# Patient Record
Sex: Male | Born: 1940 | Race: White | Hispanic: No | Marital: Single | Smoking: Former smoker
Health system: Southern US, Community
[De-identification: ages and names within clinical notes are randomized; demographics above are authoritative.]

## PROBLEM LIST (undated history)

## (undated) DIAGNOSIS — J189 Pneumonia, unspecified organism: Secondary | ICD-10-CM

## (undated) DIAGNOSIS — D696 Thrombocytopenia, unspecified: Secondary | ICD-10-CM

## (undated) DIAGNOSIS — C61 Malignant neoplasm of prostate: Secondary | ICD-10-CM

## (undated) DIAGNOSIS — R42 Dizziness and giddiness: Secondary | ICD-10-CM

## (undated) DIAGNOSIS — C7951 Secondary malignant neoplasm of bone: Secondary | ICD-10-CM

## (undated) DIAGNOSIS — D649 Anemia, unspecified: Secondary | ICD-10-CM

## (undated) DIAGNOSIS — M1990 Unspecified osteoarthritis, unspecified site: Secondary | ICD-10-CM

## (undated) HISTORY — PX: OTHER SURGICAL HISTORY: SHX169

## (undated) HISTORY — PX: NASAL SEPTUM SURGERY: SHX37

## (undated) HISTORY — PX: BACK SURGERY: SHX140

## (undated) HISTORY — PX: CERVICAL DISC SURGERY: SHX588

---

## 2006-04-29 DIAGNOSIS — C61 Malignant neoplasm of prostate: Secondary | ICD-10-CM

## 2006-04-29 HISTORY — DX: Secondary malignant neoplasm of bone: C61

## 2006-05-30 ENCOUNTER — Ambulatory Visit: Payer: Self-pay | Admitting: Urology

## 2007-07-22 ENCOUNTER — Ambulatory Visit: Payer: Self-pay | Admitting: Urology

## 2009-07-28 ENCOUNTER — Ambulatory Visit: Payer: Self-pay | Admitting: Urology

## 2009-07-28 ENCOUNTER — Ambulatory Visit: Payer: Self-pay | Admitting: Internal Medicine

## 2009-08-08 ENCOUNTER — Ambulatory Visit: Payer: Self-pay | Admitting: Internal Medicine

## 2009-08-27 ENCOUNTER — Ambulatory Visit: Payer: Self-pay | Admitting: Internal Medicine

## 2009-08-28 ENCOUNTER — Ambulatory Visit: Payer: Self-pay | Admitting: Urology

## 2009-08-29 ENCOUNTER — Ambulatory Visit: Payer: Self-pay | Admitting: Internal Medicine

## 2009-09-27 ENCOUNTER — Ambulatory Visit: Payer: Self-pay | Admitting: Internal Medicine

## 2009-10-09 ENCOUNTER — Ambulatory Visit: Payer: Self-pay | Admitting: Internal Medicine

## 2009-10-27 ENCOUNTER — Ambulatory Visit: Payer: Self-pay | Admitting: Internal Medicine

## 2009-11-23 LAB — PSA

## 2009-11-27 ENCOUNTER — Ambulatory Visit: Payer: Self-pay | Admitting: Internal Medicine

## 2009-12-08 ENCOUNTER — Ambulatory Visit: Payer: Self-pay | Admitting: Internal Medicine

## 2009-12-28 ENCOUNTER — Ambulatory Visit: Payer: Self-pay | Admitting: Internal Medicine

## 2010-01-09 LAB — PSA

## 2010-01-27 ENCOUNTER — Ambulatory Visit: Payer: Self-pay | Admitting: Internal Medicine

## 2010-03-14 ENCOUNTER — Ambulatory Visit: Payer: Self-pay | Admitting: Internal Medicine

## 2010-03-29 ENCOUNTER — Ambulatory Visit: Payer: Self-pay | Admitting: Internal Medicine

## 2010-04-29 ENCOUNTER — Ambulatory Visit: Payer: Self-pay | Admitting: Internal Medicine

## 2010-05-30 ENCOUNTER — Ambulatory Visit: Payer: Self-pay | Admitting: Internal Medicine

## 2010-06-08 ENCOUNTER — Ambulatory Visit: Payer: Self-pay | Admitting: Internal Medicine

## 2010-06-23 LAB — PSA

## 2010-06-27 ENCOUNTER — Emergency Department: Payer: Self-pay | Admitting: Emergency Medicine

## 2010-06-28 ENCOUNTER — Ambulatory Visit: Payer: Self-pay | Admitting: Internal Medicine

## 2010-06-29 ENCOUNTER — Ambulatory Visit: Payer: Self-pay | Admitting: Internal Medicine

## 2010-08-02 ENCOUNTER — Ambulatory Visit: Payer: Self-pay | Admitting: Internal Medicine

## 2010-08-28 ENCOUNTER — Ambulatory Visit: Payer: Self-pay

## 2010-08-28 ENCOUNTER — Ambulatory Visit: Payer: Self-pay | Admitting: Internal Medicine

## 2010-10-02 ENCOUNTER — Ambulatory Visit: Payer: Self-pay | Admitting: Internal Medicine

## 2010-10-03 LAB — PSA

## 2010-10-28 ENCOUNTER — Ambulatory Visit: Payer: Self-pay | Admitting: Internal Medicine

## 2010-11-28 ENCOUNTER — Ambulatory Visit: Payer: Self-pay | Admitting: Internal Medicine

## 2011-01-01 ENCOUNTER — Ambulatory Visit: Payer: Self-pay | Admitting: Internal Medicine

## 2011-01-02 LAB — PSA: PSA: 8.4 ng/mL — ABNORMAL HIGH (ref 0.0–4.0)

## 2011-01-28 ENCOUNTER — Ambulatory Visit: Payer: Self-pay | Admitting: Internal Medicine

## 2011-02-28 ENCOUNTER — Ambulatory Visit: Payer: Self-pay | Admitting: Internal Medicine

## 2011-04-02 ENCOUNTER — Ambulatory Visit: Payer: Self-pay | Admitting: Internal Medicine

## 2011-04-03 LAB — PSA: PSA: 7.8 ng/mL — ABNORMAL HIGH (ref 0.0–4.0)

## 2011-04-30 ENCOUNTER — Ambulatory Visit: Payer: Self-pay | Admitting: Internal Medicine

## 2011-05-14 LAB — CBC CANCER CENTER
Basophil %: 0.2 %
Eosinophil #: 0.1 x10 3/mm (ref 0.0–0.7)
HCT: 37.2 % — ABNORMAL LOW (ref 40.0–52.0)
HGB: 12.8 g/dL — ABNORMAL LOW (ref 13.0–18.0)
Lymphocyte #: 0.6 x10 3/mm — ABNORMAL LOW (ref 1.0–3.6)
Lymphocyte %: 7.5 %
MCH: 34.1 pg — ABNORMAL HIGH (ref 26.0–34.0)
Monocyte %: 7.5 %
Neutrophil #: 6.6 x10 3/mm — ABNORMAL HIGH (ref 1.4–6.5)
RBC: 3.76 10*6/uL — ABNORMAL LOW (ref 4.40–5.90)
WBC: 7.9 x10 3/mm (ref 3.8–10.6)

## 2011-05-14 LAB — HEPATIC FUNCTION PANEL A (ARMC)
Bilirubin, Direct: 0.1 mg/dL (ref 0.00–0.20)
Bilirubin,Total: 0.4 mg/dL (ref 0.2–1.0)
SGOT(AST): 17 U/L (ref 15–37)
SGPT (ALT): 24 U/L
Total Protein: 6.7 g/dL (ref 6.4–8.2)

## 2011-05-14 LAB — CREATININE, SERUM
Creatinine: 0.78 mg/dL (ref 0.60–1.30)
EGFR (Non-African Amer.): >60

## 2011-05-31 ENCOUNTER — Ambulatory Visit: Payer: Self-pay | Admitting: Internal Medicine

## 2011-06-25 LAB — BASIC METABOLIC PANEL
Anion Gap: 11 (ref 7–16)
BUN: 13 mg/dL (ref 7–18)
Creatinine: 0.93 mg/dL (ref 0.60–1.30)
EGFR (African American): >60
EGFR (Non-African Amer.): >60
Glucose: 100 mg/dL — ABNORMAL HIGH (ref 65–99)

## 2011-06-25 LAB — CBC CANCER CENTER
Basophil #: 0 x10 3/mm (ref 0.0–0.1)
Basophil %: 0.4 %
Eosinophil %: 0.4 %
HCT: 36.5 % — ABNORMAL LOW (ref 40.0–52.0)
HGB: 12.5 g/dL — ABNORMAL LOW (ref 13.0–18.0)
MCH: 33.6 pg (ref 26.0–34.0)
MCHC: 34.2 g/dL (ref 32.0–36.0)
MCV: 98 fL (ref 80–100)
Monocyte #: 0.5 x10 3/mm (ref 0.0–0.7)
Neutrophil #: 6.1 x10 3/mm (ref 1.4–6.5)
Platelet: 149 x10 3/mm — ABNORMAL LOW (ref 150–440)
RBC: 3.72 10*6/uL — ABNORMAL LOW (ref 4.40–5.90)
RDW: 14 % (ref 11.5–14.5)
WBC: 7.3 x10 3/mm (ref 3.8–10.6)

## 2011-06-25 LAB — HEPATIC FUNCTION PANEL A (ARMC)
Albumin: 3.8 g/dL (ref 3.4–5.0)
Bilirubin, Direct: 0.1 mg/dL (ref 0.00–0.20)
Bilirubin,Total: 0.4 mg/dL (ref 0.2–1.0)
SGOT(AST): 17 U/L (ref 15–37)
SGPT (ALT): 30 U/L
Total Protein: 7 g/dL (ref 6.4–8.2)

## 2011-06-26 LAB — PSA: PSA: 15.3 ng/mL — ABNORMAL HIGH (ref 0.0–4.0)

## 2011-06-28 ENCOUNTER — Ambulatory Visit: Payer: Self-pay | Admitting: Internal Medicine

## 2011-08-06 ENCOUNTER — Ambulatory Visit: Payer: Self-pay | Admitting: Internal Medicine

## 2011-08-06 LAB — CBC CANCER CENTER
Basophil #: 0 x10 3/mm (ref 0.0–0.1)
Eosinophil #: 0.1 x10 3/mm (ref 0.0–0.7)
Eosinophil %: 1.3 %
HGB: 12.1 g/dL — ABNORMAL LOW (ref 13.0–18.0)
Lymphocyte #: 0.6 x10 3/mm — ABNORMAL LOW (ref 1.0–3.6)
Lymphocyte %: 9.1 %
MCH: 33.5 pg (ref 26.0–34.0)
MCV: 99 fL (ref 80–100)
Monocyte %: 7.6 %
Neutrophil #: 5.1 x10 3/mm (ref 1.4–6.5)
Platelet: 130 x10 3/mm — ABNORMAL LOW (ref 150–440)
RBC: 3.59 10*6/uL — ABNORMAL LOW (ref 4.40–5.90)
WBC: 6.2 x10 3/mm (ref 3.8–10.6)

## 2011-08-06 LAB — HEPATIC FUNCTION PANEL A (ARMC)
Alkaline Phosphatase: 74 U/L (ref 50–136)
Bilirubin, Direct: 0.1 mg/dL (ref 0.00–0.20)
SGPT (ALT): 25 U/L
Total Protein: 6.3 g/dL — ABNORMAL LOW (ref 6.4–8.2)

## 2011-08-06 LAB — CREATININE, SERUM
EGFR (African American): >60
EGFR (Non-African Amer.): >60

## 2011-08-28 ENCOUNTER — Ambulatory Visit: Payer: Self-pay | Admitting: Internal Medicine

## 2011-09-17 LAB — BASIC METABOLIC PANEL
Calcium, Total: 9.6 mg/dL (ref 8.5–10.1)
Chloride: 102 mmol/L (ref 98–107)
Co2: 30 mmol/L (ref 21–32)
Creatinine: 0.91 mg/dL (ref 0.60–1.30)
EGFR (African American): >60
Sodium: 141 mmol/L (ref 136–145)

## 2011-09-17 LAB — CBC CANCER CENTER
Basophil #: 0 x10 3/mm (ref 0.0–0.1)
Eosinophil #: 0.1 x10 3/mm (ref 0.0–0.7)
Eosinophil %: 1.2 %
HGB: 12.4 g/dL — ABNORMAL LOW (ref 13.0–18.0)
Lymphocyte #: 0.5 x10 3/mm — ABNORMAL LOW (ref 1.0–3.6)
MCH: 33.4 pg (ref 26.0–34.0)
MCV: 100 fL (ref 80–100)
Neutrophil #: 5 x10 3/mm (ref 1.4–6.5)
Platelet: 129 x10 3/mm — ABNORMAL LOW (ref 150–440)
WBC: 6.1 x10 3/mm (ref 3.8–10.6)

## 2011-09-17 LAB — HEPATIC FUNCTION PANEL A (ARMC)
Albumin: 3.5 g/dL (ref 3.4–5.0)
Alkaline Phosphatase: 86 U/L (ref 50–136)
Bilirubin, Direct: 0.1 mg/dL (ref 0.00–0.20)
Bilirubin,Total: 0.3 mg/dL (ref 0.2–1.0)
SGOT(AST): 14 U/L — ABNORMAL LOW (ref 15–37)
SGPT (ALT): 27 U/L
Total Protein: 6.3 g/dL — ABNORMAL LOW (ref 6.4–8.2)

## 2011-09-28 ENCOUNTER — Ambulatory Visit: Payer: Self-pay | Admitting: Internal Medicine

## 2011-09-28 ENCOUNTER — Ambulatory Visit: Payer: Self-pay

## 2011-11-05 ENCOUNTER — Ambulatory Visit: Payer: Self-pay | Admitting: Internal Medicine

## 2011-11-05 LAB — CBC CANCER CENTER
Basophil #: 0 x10 3/mm (ref 0.0–0.1)
Basophil %: 0.1 %
Eosinophil %: 1.3 %
HCT: 36.1 % — ABNORMAL LOW (ref 40.0–52.0)
HGB: 12.1 g/dL — ABNORMAL LOW (ref 13.0–18.0)
Lymphocyte %: 7.8 %
MCH: 32.9 pg (ref 26.0–34.0)
MCV: 98 fL (ref 80–100)
Monocyte %: 6.2 %
Neutrophil #: 8.3 x10 3/mm — ABNORMAL HIGH (ref 1.4–6.5)
Platelet: 222 x10 3/mm (ref 150–440)
RBC: 3.67 10*6/uL — ABNORMAL LOW (ref 4.40–5.90)
WBC: 9.8 x10 3/mm (ref 3.8–10.6)

## 2011-11-05 LAB — HEPATIC FUNCTION PANEL A (ARMC)
Albumin: 3.4 g/dL (ref 3.4–5.0)
Alkaline Phosphatase: 92 U/L (ref 50–136)
Bilirubin,Total: 0.4 mg/dL (ref 0.2–1.0)
SGOT(AST): 15 U/L (ref 15–37)
SGPT (ALT): 25 U/L
Total Protein: 6.7 g/dL (ref 6.4–8.2)

## 2011-11-05 LAB — CREATININE, SERUM
Creatinine: 0.92 mg/dL (ref 0.60–1.30)
EGFR (African American): >60

## 2011-11-07 DIAGNOSIS — C61 Malignant neoplasm of prostate: Secondary | ICD-10-CM | POA: Insufficient documentation

## 2011-11-07 DIAGNOSIS — I10 Essential (primary) hypertension: Secondary | ICD-10-CM | POA: Insufficient documentation

## 2011-11-28 ENCOUNTER — Ambulatory Visit: Payer: Self-pay | Admitting: Internal Medicine

## 2011-12-10 LAB — BASIC METABOLIC PANEL
Anion Gap: 10 (ref 7–16)
BUN: 16 mg/dL (ref 7–18)
Chloride: 102 mmol/L (ref 98–107)
Creatinine: 0.91 mg/dL (ref 0.60–1.30)
EGFR (African American): >60
EGFR (Non-African Amer.): >60
Osmolality: 284 (ref 275–301)
Sodium: 142 mmol/L (ref 136–145)

## 2011-12-10 LAB — CBC CANCER CENTER
Basophil %: 0.6 %
HGB: 12.2 g/dL — ABNORMAL LOW (ref 13.0–18.0)
Lymphocyte #: 0.6 x10 3/mm — ABNORMAL LOW (ref 1.0–3.6)
Lymphocyte %: 8.8 %
MCV: 101 fL — ABNORMAL HIGH (ref 80–100)
Monocyte %: 8.3 %
Neutrophil #: 5.2 x10 3/mm (ref 1.4–6.5)
RBC: 3.6 10*6/uL — ABNORMAL LOW (ref 4.40–5.90)
RDW: 14.9 % — ABNORMAL HIGH (ref 11.5–14.5)
WBC: 6.4 x10 3/mm (ref 3.8–10.6)

## 2011-12-10 LAB — HEPATIC FUNCTION PANEL A (ARMC)
Bilirubin, Direct: 0.1 mg/dL (ref 0.00–0.20)
Bilirubin,Total: 0.4 mg/dL (ref 0.2–1.0)

## 2011-12-27 DIAGNOSIS — C7951 Secondary malignant neoplasm of bone: Secondary | ICD-10-CM | POA: Insufficient documentation

## 2011-12-27 DIAGNOSIS — R339 Retention of urine, unspecified: Secondary | ICD-10-CM | POA: Insufficient documentation

## 2011-12-27 DIAGNOSIS — C61 Malignant neoplasm of prostate: Secondary | ICD-10-CM | POA: Insufficient documentation

## 2011-12-27 DIAGNOSIS — N139 Obstructive and reflux uropathy, unspecified: Secondary | ICD-10-CM | POA: Insufficient documentation

## 2011-12-29 ENCOUNTER — Ambulatory Visit: Payer: Self-pay | Admitting: Internal Medicine

## 2012-01-21 LAB — HEPATIC FUNCTION PANEL A (ARMC)
Albumin: 3.5 g/dL (ref 3.4–5.0)
Alkaline Phosphatase: 76 U/L (ref 50–136)
Bilirubin, Direct: 0.1 mg/dL (ref 0.00–0.20)
Bilirubin,Total: 0.4 mg/dL (ref 0.2–1.0)
SGPT (ALT): 30 U/L (ref 12–78)
Total Protein: 6.7 g/dL (ref 6.4–8.2)

## 2012-01-21 LAB — CBC CANCER CENTER
Basophil #: 0 x10 3/mm (ref 0.0–0.1)
Eosinophil #: 0.1 x10 3/mm (ref 0.0–0.7)
HCT: 36 % — ABNORMAL LOW (ref 40.0–52.0)
Lymphocyte %: 7.9 %
MCH: 34 pg (ref 26.0–34.0)
MCHC: 34.1 g/dL (ref 32.0–36.0)
Monocyte #: 0.5 x10 3/mm (ref 0.2–1.0)
Neutrophil #: 6.1 x10 3/mm (ref 1.4–6.5)
Neutrophil %: 84.4 %
Platelet: 156 x10 3/mm (ref 150–440)
RDW: 13.9 % (ref 11.5–14.5)

## 2012-01-21 LAB — CREATININE, SERUM
Creatinine: 0.9 mg/dL (ref 0.60–1.30)
EGFR (African American): >60
EGFR (Non-African Amer.): >60

## 2012-01-28 ENCOUNTER — Ambulatory Visit: Payer: Self-pay | Admitting: Internal Medicine

## 2012-03-03 ENCOUNTER — Ambulatory Visit: Payer: Self-pay | Admitting: Internal Medicine

## 2012-03-03 LAB — HEPATIC FUNCTION PANEL A (ARMC)
Albumin: 3.8 g/dL (ref 3.4–5.0)
Alkaline Phosphatase: 71 U/L (ref 50–136)
Bilirubin, Direct: 0.2 mg/dL (ref 0.00–0.20)
Bilirubin,Total: 0.6 mg/dL (ref 0.2–1.0)
SGOT(AST): 17 U/L (ref 15–37)
SGPT (ALT): 25 U/L (ref 12–78)

## 2012-03-03 LAB — CBC CANCER CENTER
Basophil %: 0.1 %
Eosinophil #: 0.1 x10 3/mm (ref 0.0–0.7)
Eosinophil %: 0.8 %
HCT: 39.6 % — ABNORMAL LOW (ref 40.0–52.0)
HGB: 13 g/dL (ref 13.0–18.0)
Lymphocyte #: 0.6 x10 3/mm — ABNORMAL LOW (ref 1.0–3.6)
MCH: 33.2 pg (ref 26.0–34.0)
MCHC: 32.9 g/dL (ref 32.0–36.0)
MCV: 101 fL — ABNORMAL HIGH (ref 80–100)
Monocyte #: 0.6 x10 3/mm (ref 0.2–1.0)
Neutrophil #: 6.7 x10 3/mm — ABNORMAL HIGH (ref 1.4–6.5)
RBC: 3.92 10*6/uL — ABNORMAL LOW (ref 4.40–5.90)

## 2012-03-03 LAB — BASIC METABOLIC PANEL
Anion Gap: 9 (ref 7–16)
BUN: 17 mg/dL (ref 7–18)
Chloride: 101 mmol/L (ref 98–107)
Creatinine: 0.78 mg/dL (ref 0.60–1.30)
EGFR (African American): >60
EGFR (Non-African Amer.): >60
Glucose: 92 mg/dL (ref 65–99)
Osmolality: 279 (ref 275–301)
Potassium: 4.1 mmol/L (ref 3.5–5.1)

## 2012-03-04 LAB — PSA: PSA: 16.9 ng/mL — ABNORMAL HIGH (ref 0.0–4.0)

## 2012-03-29 ENCOUNTER — Ambulatory Visit: Payer: Self-pay | Admitting: Internal Medicine

## 2012-04-14 LAB — CBC CANCER CENTER
Basophil #: 0 x10 3/mm (ref 0.0–0.1)
Eosinophil #: 0.1 x10 3/mm (ref 0.0–0.7)
HCT: 36.6 % — ABNORMAL LOW (ref 40.0–52.0)
HGB: 12.7 g/dL — ABNORMAL LOW (ref 13.0–18.0)
Lymphocyte %: 6.1 %
MCHC: 34.8 g/dL (ref 32.0–36.0)
Monocyte #: 0.6 x10 3/mm (ref 0.2–1.0)
Monocyte %: 6.6 %
Neutrophil %: 86.4 %
Platelet: 127 x10 3/mm — ABNORMAL LOW (ref 150–440)
RDW: 14.3 % (ref 11.5–14.5)
WBC: 8.6 x10 3/mm (ref 3.8–10.6)

## 2012-04-14 LAB — CREATININE, SERUM
Creatinine: 0.82 mg/dL (ref 0.60–1.30)
EGFR (African American): >60
EGFR (Non-African Amer.): >60

## 2012-04-14 LAB — HEPATIC FUNCTION PANEL A (ARMC)
Albumin: 3.6 g/dL (ref 3.4–5.0)
Bilirubin, Direct: 0.1 mg/dL (ref 0.00–0.20)
SGOT(AST): 15 U/L (ref 15–37)
SGPT (ALT): 27 U/L (ref 12–78)
Total Protein: 6.6 g/dL (ref 6.4–8.2)

## 2012-04-29 ENCOUNTER — Ambulatory Visit: Payer: Self-pay | Admitting: Internal Medicine

## 2012-05-14 LAB — CBC CANCER CENTER
Basophil #: 0.1 x10 3/mm (ref 0.0–0.1)
Basophil %: 0.7 %
Eosinophil %: 0.4 %
HGB: 13.1 g/dL (ref 13.0–18.0)
Lymphocyte #: 0.5 x10 3/mm — ABNORMAL LOW (ref 1.0–3.6)
Lymphocyte %: 5.1 %
MCH: 32.7 pg (ref 26.0–34.0)
Monocyte #: 0.7 x10 3/mm (ref 0.2–1.0)
Monocyte %: 6.4 %
Neutrophil %: 87.4 %
Platelet: 148 x10 3/mm — ABNORMAL LOW (ref 150–440)
RBC: 4.02 10*6/uL — ABNORMAL LOW (ref 4.40–5.90)
WBC: 10.3 x10 3/mm (ref 3.8–10.6)

## 2012-05-14 LAB — HEPATIC FUNCTION PANEL A (ARMC)
Albumin: 3.9 g/dL (ref 3.4–5.0)
Bilirubin, Direct: 0.2 mg/dL (ref 0.00–0.20)
Bilirubin,Total: 0.4 mg/dL (ref 0.2–1.0)
SGOT(AST): 16 U/L (ref 15–37)

## 2012-05-14 LAB — CREATININE, SERUM
Creatinine: 0.82 mg/dL (ref 0.60–1.30)
EGFR (Non-African Amer.): >60

## 2012-05-30 ENCOUNTER — Ambulatory Visit: Payer: Self-pay | Admitting: Internal Medicine

## 2012-06-18 LAB — CBC CANCER CENTER
Basophil #: 0.1 x10 3/mm (ref 0.0–0.1)
Eosinophil #: 0.1 x10 3/mm (ref 0.0–0.7)
Eosinophil %: 1.8 %
Lymphocyte #: 0.8 x10 3/mm — ABNORMAL LOW (ref 1.0–3.6)
Lymphocyte %: 9.9 %
MCH: 33 pg (ref 26.0–34.0)
MCHC: 34.7 g/dL (ref 32.0–36.0)
MCV: 95 fL (ref 80–100)
Monocyte #: 0.6 x10 3/mm (ref 0.2–1.0)
Neutrophil #: 6.2 x10 3/mm (ref 1.4–6.5)
Neutrophil %: 79.1 %
Platelet: 156 x10 3/mm (ref 150–440)

## 2012-06-18 LAB — CREATININE, SERUM
Creatinine: 0.76 mg/dL (ref 0.60–1.30)
EGFR (African American): >60
EGFR (Non-African Amer.): >60

## 2012-06-18 LAB — HEPATIC FUNCTION PANEL A (ARMC)
Alkaline Phosphatase: 55 U/L (ref 50–136)
Bilirubin, Direct: 0.1 mg/dL (ref 0.00–0.20)
Bilirubin,Total: 0.3 mg/dL (ref 0.2–1.0)

## 2012-06-18 LAB — GLUCOSE, RANDOM: Glucose: 94 mg/dL (ref 65–99)

## 2012-06-27 ENCOUNTER — Ambulatory Visit: Payer: Self-pay | Admitting: Internal Medicine

## 2012-07-10 LAB — CREATININE, SERUM
Creatinine: 0.97 mg/dL (ref 0.60–1.30)
EGFR (African American): >60
EGFR (Non-African Amer.): >60

## 2012-07-10 LAB — CBC CANCER CENTER
Basophil #: 0.1 x10 3/mm (ref 0.0–0.1)
Basophil %: 0.6 %
Eosinophil #: 0.1 x10 3/mm (ref 0.0–0.7)
HGB: 13.1 g/dL (ref 13.0–18.0)
Lymphocyte #: 0.6 x10 3/mm — ABNORMAL LOW (ref 1.0–3.6)
Lymphocyte %: 5.7 %
MCH: 31.9 pg (ref 26.0–34.0)
Neutrophil #: 8.9 x10 3/mm — ABNORMAL HIGH (ref 1.4–6.5)
Platelet: 140 x10 3/mm — ABNORMAL LOW (ref 150–440)
RBC: 4.12 10*6/uL — ABNORMAL LOW (ref 4.40–5.90)
WBC: 10.4 x10 3/mm (ref 3.8–10.6)

## 2012-07-10 LAB — HEPATIC FUNCTION PANEL A (ARMC)
Albumin: 3.7 g/dL (ref 3.4–5.0)
Alkaline Phosphatase: 73 U/L (ref 50–136)
Bilirubin, Direct: 0.1 mg/dL (ref 0.00–0.20)
Bilirubin,Total: 0.7 mg/dL (ref 0.2–1.0)
SGOT(AST): 13 U/L — ABNORMAL LOW (ref 15–37)
SGPT (ALT): 21 U/L (ref 12–78)

## 2012-07-10 LAB — GLUCOSE, RANDOM: Glucose: 125 mg/dL — ABNORMAL HIGH (ref 65–99)

## 2012-07-28 ENCOUNTER — Ambulatory Visit: Payer: Self-pay | Admitting: Internal Medicine

## 2012-08-06 LAB — CBC CANCER CENTER
Basophil #: 0 x10 3/mm (ref 0.0–0.1)
Basophil %: 0.3 %
Eosinophil #: 0.1 x10 3/mm (ref 0.0–0.7)
Eosinophil %: 1 %
Lymphocyte #: 0.6 x10 3/mm — ABNORMAL LOW (ref 1.0–3.6)
Lymphocyte %: 7.7 %
MCHC: 34.1 g/dL (ref 32.0–36.0)
MCV: 94 fL (ref 80–100)
Monocyte #: 0.7 x10 3/mm (ref 0.2–1.0)
Neutrophil #: 6.7 x10 3/mm — ABNORMAL HIGH (ref 1.4–6.5)
Neutrophil %: 82.9 %
RBC: 3.78 10*6/uL — ABNORMAL LOW (ref 4.40–5.90)
RDW: 14.2 % (ref 11.5–14.5)
WBC: 8.1 x10 3/mm (ref 3.8–10.6)

## 2012-08-06 LAB — HEPATIC FUNCTION PANEL A (ARMC)
Albumin: 3.5 g/dL (ref 3.4–5.0)
Alkaline Phosphatase: 59 U/L (ref 50–136)
Bilirubin, Direct: 0.2 mg/dL (ref 0.00–0.20)
Bilirubin,Total: 0.5 mg/dL (ref 0.2–1.0)
SGPT (ALT): 19 U/L (ref 12–78)
Total Protein: 6.4 g/dL (ref 6.4–8.2)

## 2012-08-06 LAB — BASIC METABOLIC PANEL
BUN: 7 mg/dL (ref 7–18)
Calcium, Total: 9 mg/dL (ref 8.5–10.1)
Chloride: 106 mmol/L (ref 98–107)
Co2: 31 mmol/L (ref 21–32)
Creatinine: 0.75 mg/dL (ref 0.60–1.30)
EGFR (African American): >60
Glucose: 89 mg/dL (ref 65–99)
Osmolality: 288 (ref 275–301)
Potassium: 3.5 mmol/L (ref 3.5–5.1)

## 2012-08-07 LAB — PSA: PSA: 21 ng/mL — ABNORMAL HIGH (ref 0.0–4.0)

## 2012-08-27 ENCOUNTER — Ambulatory Visit: Payer: Self-pay | Admitting: Internal Medicine

## 2012-09-10 LAB — CBC CANCER CENTER
Basophil #: 0 x10 3/mm (ref 0.0–0.1)
Basophil %: 0.6 %
Eosinophil #: 0.1 x10 3/mm (ref 0.0–0.7)
Eosinophil %: 1.5 %
Lymphocyte #: 0.9 x10 3/mm — ABNORMAL LOW (ref 1.0–3.6)
Lymphocyte %: 11.1 %
MCHC: 33.3 g/dL (ref 32.0–36.0)
Neutrophil #: 6.2 x10 3/mm (ref 1.4–6.5)
Neutrophil %: 78.5 %
Platelet: 137 x10 3/mm — ABNORMAL LOW (ref 150–440)
RBC: 4.05 10*6/uL — ABNORMAL LOW (ref 4.40–5.90)

## 2012-09-10 LAB — CREATININE, SERUM
Creatinine: 0.79 mg/dL (ref 0.60–1.30)
EGFR (African American): >60

## 2012-09-10 LAB — GLUCOSE, RANDOM: Glucose: 88 mg/dL (ref 65–99)

## 2012-09-10 LAB — HEPATIC FUNCTION PANEL A (ARMC)
Alkaline Phosphatase: 64 U/L (ref 50–136)
Bilirubin,Total: 0.4 mg/dL (ref 0.2–1.0)
SGPT (ALT): 19 U/L (ref 12–78)
Total Protein: 6.5 g/dL (ref 6.4–8.2)

## 2012-09-10 LAB — MAGNESIUM: Magnesium: 1.7 mg/dL — ABNORMAL LOW

## 2012-09-27 ENCOUNTER — Ambulatory Visit: Payer: Self-pay | Admitting: Internal Medicine

## 2012-10-08 ENCOUNTER — Ambulatory Visit: Payer: Self-pay | Admitting: Internal Medicine

## 2012-10-08 LAB — CBC CANCER CENTER
Basophil #: 0 x10 3/mm (ref 0.0–0.1)
Eosinophil #: 0.1 x10 3/mm (ref 0.0–0.7)
Eosinophil %: 0.9 %
HCT: 38.3 % — ABNORMAL LOW (ref 40.0–52.0)
HGB: 12.7 g/dL — ABNORMAL LOW (ref 13.0–18.0)
Lymphocyte #: 0.7 x10 3/mm — ABNORMAL LOW (ref 1.0–3.6)
Lymphocyte %: 8.5 %
MCH: 31.7 pg (ref 26.0–34.0)
MCHC: 33.2 g/dL (ref 32.0–36.0)
MCV: 95 fL (ref 80–100)
Monocyte #: 0.7 x10 3/mm (ref 0.2–1.0)
Monocyte %: 9 %
Platelet: 157 x10 3/mm (ref 150–440)
RDW: 14.9 % — ABNORMAL HIGH (ref 11.5–14.5)
WBC: 7.7 x10 3/mm (ref 3.8–10.6)

## 2012-10-08 LAB — MAGNESIUM: Magnesium: 1.5 mg/dL — ABNORMAL LOW

## 2012-10-08 LAB — HEPATIC FUNCTION PANEL A (ARMC)
Albumin: 3.7 g/dL (ref 3.4–5.0)
Alkaline Phosphatase: 71 U/L (ref 50–136)
Bilirubin, Direct: 0.1 mg/dL (ref 0.00–0.20)
Bilirubin,Total: 0.5 mg/dL (ref 0.2–1.0)
SGOT(AST): 15 U/L (ref 15–37)
SGPT (ALT): 18 U/L (ref 12–78)
Total Protein: 6.7 g/dL (ref 6.4–8.2)

## 2012-10-08 LAB — CREATININE, SERUM: EGFR (Non-African Amer.): >60

## 2012-10-27 ENCOUNTER — Ambulatory Visit: Payer: Self-pay | Admitting: Internal Medicine

## 2012-11-12 LAB — BASIC METABOLIC PANEL
Anion Gap: 9 (ref 7–16)
BUN: 15 mg/dL (ref 7–18)
Calcium, Total: 9.3 mg/dL (ref 8.5–10.1)
Chloride: 102 mmol/L (ref 98–107)
Co2: 32 mmol/L (ref 21–32)
Creatinine: 0.85 mg/dL (ref 0.60–1.30)
EGFR (African American): >60
EGFR (Non-African Amer.): >60
Glucose: 99 mg/dL (ref 65–99)
Osmolality: 286 (ref 275–301)
Potassium: 3.2 mmol/L — ABNORMAL LOW (ref 3.5–5.1)
Sodium: 143 mmol/L (ref 136–145)

## 2012-11-12 LAB — CBC CANCER CENTER
Basophil #: 0 x10 3/mm (ref 0.0–0.1)
Basophil %: 0.7 %
Eosinophil #: 0.1 x10 3/mm (ref 0.0–0.7)
HCT: 40.7 % (ref 40.0–52.0)
Lymphocyte #: 0.9 x10 3/mm — ABNORMAL LOW (ref 1.0–3.6)
MCH: 32.9 pg (ref 26.0–34.0)
MCHC: 34.3 g/dL (ref 32.0–36.0)
MCV: 96 fL (ref 80–100)
Monocyte #: 0.6 x10 3/mm (ref 0.2–1.0)
Monocyte %: 8.9 %
Neutrophil %: 75 %
RDW: 14.3 % (ref 11.5–14.5)
WBC: 6.2 x10 3/mm (ref 3.8–10.6)

## 2012-11-12 LAB — HEPATIC FUNCTION PANEL A (ARMC)
Albumin: 3.9 g/dL (ref 3.4–5.0)
Bilirubin, Direct: 0.1 mg/dL (ref 0.00–0.20)
SGOT(AST): 13 U/L — ABNORMAL LOW (ref 15–37)
SGPT (ALT): 20 U/L (ref 12–78)

## 2012-11-12 LAB — MAGNESIUM: Magnesium: 1.6 mg/dL — ABNORMAL LOW

## 2012-11-14 LAB — PSA: PSA: 25.2 ng/mL — ABNORMAL HIGH (ref 0.0–4.0)

## 2012-11-19 LAB — POTASSIUM: Potassium: 3.5 mmol/L (ref 3.5–5.1)

## 2012-11-27 ENCOUNTER — Ambulatory Visit: Payer: Self-pay | Admitting: Internal Medicine

## 2012-12-31 ENCOUNTER — Ambulatory Visit: Payer: Self-pay | Admitting: Internal Medicine

## 2012-12-31 LAB — COMPREHENSIVE METABOLIC PANEL
Albumin: 3.6 g/dL (ref 3.4–5.0)
Alkaline Phosphatase: 80 U/L (ref 50–136)
Anion Gap: 9 (ref 7–16)
BUN: 14 mg/dL (ref 7–18)
Calcium, Total: 9 mg/dL (ref 8.5–10.1)
Co2: 29 mmol/L (ref 21–32)
Creatinine: 0.82 mg/dL (ref 0.60–1.30)
EGFR (African American): >60
EGFR (Non-African Amer.): >60
Glucose: 97 mg/dL (ref 65–99)
Total Protein: 6.7 g/dL (ref 6.4–8.2)

## 2012-12-31 LAB — CBC CANCER CENTER
Basophil #: 0 x10 3/mm (ref 0.0–0.1)
Basophil %: 0.4 %
Eosinophil #: 0.1 x10 3/mm (ref 0.0–0.7)
Eosinophil %: 1.8 %
HGB: 13.1 g/dL (ref 13.0–18.0)
Lymphocyte #: 0.6 x10 3/mm — ABNORMAL LOW (ref 1.0–3.6)
Lymphocyte %: 9.8 %
MCH: 33.1 pg (ref 26.0–34.0)
MCHC: 33.5 g/dL (ref 32.0–36.0)
Monocyte #: 0.6 x10 3/mm (ref 0.2–1.0)
Monocyte %: 8.8 %
Neutrophil #: 5 x10 3/mm (ref 1.4–6.5)
Platelet: 145 x10 3/mm — ABNORMAL LOW (ref 150–440)
RDW: 14.4 % (ref 11.5–14.5)

## 2012-12-31 LAB — MAGNESIUM: Magnesium: 2 mg/dL

## 2013-01-01 LAB — PSA: PSA: 27.5 ng/mL — ABNORMAL HIGH (ref 0.0–4.0)

## 2013-01-27 ENCOUNTER — Ambulatory Visit: Payer: Self-pay | Admitting: Internal Medicine

## 2013-01-28 LAB — CBC CANCER CENTER
Basophil #: 0 x10 3/mm (ref 0.0–0.1)
Basophil %: 0.4 %
HCT: 39 % — ABNORMAL LOW (ref 40.0–52.0)
HGB: 13.1 g/dL (ref 13.0–18.0)
Lymphocyte #: 0.7 x10 3/mm — ABNORMAL LOW (ref 1.0–3.6)
Lymphocyte %: 11.2 %
MCV: 96 fL (ref 80–100)
Monocyte %: 9.6 %
RBC: 4.07 10*6/uL — ABNORMAL LOW (ref 4.40–5.90)
WBC: 6.7 x10 3/mm (ref 3.8–10.6)

## 2013-01-28 LAB — HEPATIC FUNCTION PANEL A (ARMC)
Albumin: 3.6 g/dL (ref 3.4–5.0)
Alkaline Phosphatase: 79 U/L (ref 50–136)
Bilirubin, Direct: 0.1 mg/dL (ref 0.00–0.20)
SGOT(AST): 14 U/L — ABNORMAL LOW (ref 15–37)
SGPT (ALT): 17 U/L (ref 12–78)
Total Protein: 7.2 g/dL (ref 6.4–8.2)

## 2013-01-28 LAB — GLUCOSE, RANDOM: Glucose: 108 mg/dL — ABNORMAL HIGH (ref 65–99)

## 2013-01-28 LAB — CALCIUM: Calcium, Total: 9.2 mg/dL (ref 8.5–10.1)

## 2013-01-28 LAB — CREATININE, SERUM: EGFR (African American): >60

## 2013-02-11 DIAGNOSIS — Z7689 Persons encountering health services in other specified circumstances: Secondary | ICD-10-CM | POA: Insufficient documentation

## 2013-02-27 ENCOUNTER — Ambulatory Visit: Payer: Self-pay | Admitting: Internal Medicine

## 2013-03-11 ENCOUNTER — Ambulatory Visit: Payer: Self-pay | Admitting: Emergency Medicine

## 2013-03-11 ENCOUNTER — Inpatient Hospital Stay: Payer: Self-pay | Admitting: Internal Medicine

## 2013-03-11 LAB — CBC WITH DIFFERENTIAL/PLATELET
Basophil #: 0 10*3/uL (ref 0.0–0.1)
Basophil %: 0.2 %
Eosinophil #: 0 10*3/uL (ref 0.0–0.7)
HCT: 37.9 % — ABNORMAL LOW (ref 40.0–52.0)
Lymphocyte #: 0.4 10*3/uL — ABNORMAL LOW (ref 1.0–3.6)
Lymphocyte %: 3.4 %
MCH: 33.1 pg (ref 26.0–34.0)
MCHC: 35 g/dL (ref 32.0–36.0)
MCV: 94 fL (ref 80–100)
Monocyte #: 1.3 x10 3/mm — ABNORMAL HIGH (ref 0.2–1.0)
Monocyte %: 10.3 %
Neutrophil #: 10.8 10*3/uL — ABNORMAL HIGH (ref 1.4–6.5)
Neutrophil %: 86 %
Platelet: 190 10*3/uL (ref 150–440)
RBC: 4.01 10*6/uL — ABNORMAL LOW (ref 4.40–5.90)
RDW: 14.3 % (ref 11.5–14.5)

## 2013-03-11 LAB — COMPREHENSIVE METABOLIC PANEL
Albumin: 3.3 g/dL — ABNORMAL LOW (ref 3.4–5.0)
Alkaline Phosphatase: 89 U/L (ref 50–136)
Anion Gap: 4 — ABNORMAL LOW (ref 7–16)
BUN: 10 mg/dL (ref 7–18)
Calcium, Total: 8.9 mg/dL (ref 8.5–10.1)
Chloride: 100 mmol/L (ref 98–107)
Creatinine: 0.75 mg/dL (ref 0.60–1.30)
EGFR (African American): >60
EGFR (Non-African Amer.): >60
Glucose: 96 mg/dL (ref 65–99)
Osmolality: 273 (ref 275–301)
Potassium: 2.3 mmol/L — CL (ref 3.5–5.1)
Sodium: 137 mmol/L (ref 136–145)

## 2013-03-11 LAB — TROPONIN I: Troponin-I: <0.02 ng/mL

## 2013-03-11 LAB — MAGNESIUM: Magnesium: 1.8 mg/dL

## 2013-03-11 LAB — POTASSIUM: Potassium: 2.9 mmol/L — ABNORMAL LOW (ref 3.5–5.1)

## 2013-03-12 LAB — CBC WITH DIFFERENTIAL/PLATELET
Basophil #: 0 10*3/uL (ref 0.0–0.1)
Basophil %: 0.1 %
HCT: 34 % — ABNORMAL LOW (ref 40.0–52.0)
HGB: 11.6 g/dL — ABNORMAL LOW (ref 13.0–18.0)
Lymphocyte #: 0.2 10*3/uL — ABNORMAL LOW (ref 1.0–3.6)
Lymphocyte %: 1.8 %
MCV: 96 fL (ref 80–100)
Neutrophil #: 10.7 10*3/uL — ABNORMAL HIGH (ref 1.4–6.5)
RBC: 3.54 10*6/uL — ABNORMAL LOW (ref 4.40–5.90)
RDW: 14.5 % (ref 11.5–14.5)
WBC: 11.4 10*3/uL — ABNORMAL HIGH (ref 3.8–10.6)

## 2013-03-12 LAB — BASIC METABOLIC PANEL
BUN: 11 mg/dL (ref 7–18)
Calcium, Total: 7.9 mg/dL — ABNORMAL LOW (ref 8.5–10.1)
EGFR (African American): >60
Glucose: 138 mg/dL — ABNORMAL HIGH (ref 65–99)
Potassium: 3 mmol/L — ABNORMAL LOW (ref 3.5–5.1)

## 2013-03-13 DIAGNOSIS — E876 Hypokalemia: Secondary | ICD-10-CM

## 2013-03-13 LAB — VANCOMYCIN, TROUGH: Vancomycin, Trough: 8 ug/mL — ABNORMAL LOW (ref 10–20)

## 2013-03-13 LAB — POTASSIUM: Potassium: 3.9 mmol/L (ref 3.5–5.1)

## 2013-03-14 ENCOUNTER — Ambulatory Visit: Payer: Self-pay | Admitting: Internal Medicine

## 2013-03-16 LAB — CULTURE, BLOOD (SINGLE)

## 2013-03-23 LAB — CALCIUM: Calcium, Total: 10.3 mg/dL — ABNORMAL HIGH (ref 8.5–10.1)

## 2013-03-23 LAB — CBC CANCER CENTER
Basophil %: 0.2 %
Eosinophil #: 0.1 x10 3/mm (ref 0.0–0.7)
Lymphocyte #: 0.5 x10 3/mm — ABNORMAL LOW (ref 1.0–3.6)
Lymphocyte %: 4.9 %
MCV: 98 fL (ref 80–100)
Monocyte %: 4.8 %
Platelet: 178 x10 3/mm (ref 150–440)
RBC: 3.69 10*6/uL — ABNORMAL LOW (ref 4.40–5.90)
RDW: 15.2 % — ABNORMAL HIGH (ref 11.5–14.5)
WBC: 10.2 x10 3/mm (ref 3.8–10.6)

## 2013-03-23 LAB — HEPATIC FUNCTION PANEL A (ARMC)
Albumin: 3.4 g/dL (ref 3.4–5.0)
Bilirubin,Total: 0.5 mg/dL (ref 0.2–1.0)
SGOT(AST): 18 U/L (ref 15–37)
Total Protein: 6.4 g/dL (ref 6.4–8.2)

## 2013-03-23 LAB — CREATININE, SERUM: EGFR (Non-African Amer.): >60

## 2013-03-23 LAB — GLUCOSE, RANDOM: Glucose: 103 mg/dL — ABNORMAL HIGH (ref 65–99)

## 2013-03-23 LAB — MAGNESIUM: Magnesium: 1.6 mg/dL — ABNORMAL LOW

## 2013-03-29 ENCOUNTER — Ambulatory Visit: Payer: Self-pay | Admitting: Internal Medicine

## 2013-03-29 LAB — PSA: PSA: 61.2 ng/mL — ABNORMAL HIGH (ref 0.0–4.0)

## 2013-04-29 ENCOUNTER — Ambulatory Visit: Payer: Self-pay | Admitting: Internal Medicine

## 2013-05-06 LAB — HEPATIC FUNCTION PANEL A (ARMC)
ALK PHOS: 73 U/L
AST: 13 U/L — AB (ref 15–37)
Albumin: 3.7 g/dL (ref 3.4–5.0)
BILIRUBIN DIRECT: 0.2 mg/dL (ref 0.00–0.20)
Bilirubin,Total: 0.4 mg/dL (ref 0.2–1.0)
SGPT (ALT): 21 U/L (ref 12–78)
Total Protein: 6.7 g/dL (ref 6.4–8.2)

## 2013-05-06 LAB — CBC CANCER CENTER
Basophil #: 0 x10 3/mm (ref 0.0–0.1)
Basophil %: 0.5 %
EOS ABS: 0.1 x10 3/mm (ref 0.0–0.7)
Eosinophil %: 1.8 %
HCT: 36.8 % — ABNORMAL LOW (ref 40.0–52.0)
HGB: 12.5 g/dL — ABNORMAL LOW (ref 13.0–18.0)
Lymphocyte #: 0.8 x10 3/mm — ABNORMAL LOW (ref 1.0–3.6)
Lymphocyte %: 11.5 %
MCH: 32.7 pg (ref 26.0–34.0)
MCHC: 34.1 g/dL (ref 32.0–36.0)
MCV: 96 fL (ref 80–100)
MONO ABS: 0.5 x10 3/mm (ref 0.2–1.0)
Monocyte %: 7.9 %
Neutrophil #: 5.2 x10 3/mm (ref 1.4–6.5)
Neutrophil %: 78.3 %
PLATELETS: 157 x10 3/mm (ref 150–440)
RBC: 3.84 10*6/uL — ABNORMAL LOW (ref 4.40–5.90)
RDW: 14.2 % (ref 11.5–14.5)
WBC: 6.6 x10 3/mm (ref 3.8–10.6)

## 2013-05-06 LAB — BASIC METABOLIC PANEL
Anion Gap: 4 — ABNORMAL LOW (ref 7–16)
BUN: 15 mg/dL (ref 7–18)
CHLORIDE: 102 mmol/L (ref 98–107)
Calcium, Total: 9.4 mg/dL (ref 8.5–10.1)
Co2: 33 mmol/L — ABNORMAL HIGH (ref 21–32)
Creatinine: 0.8 mg/dL (ref 0.60–1.30)
EGFR (African American): >60
EGFR (Non-African Amer.): >60
Glucose: 89 mg/dL (ref 65–99)
OSMOLALITY: 278 (ref 275–301)
POTASSIUM: 3.7 mmol/L (ref 3.5–5.1)
Sodium: 139 mmol/L (ref 136–145)

## 2013-05-06 LAB — LIPID PANEL
Cholesterol: 207 mg/dL — ABNORMAL HIGH (ref 0–200)
HDL Cholesterol: 59 mg/dL (ref 40–60)
LDL CHOLESTEROL, CALC: 126 mg/dL — AB (ref 0–100)
Triglycerides: 112 mg/dL (ref 0–200)
VLDL Cholesterol, Calc: 22 mg/dL (ref 5–40)

## 2013-05-06 LAB — MAGNESIUM: Magnesium: 2 mg/dL

## 2013-05-08 LAB — PSA: PSA: 59.7 ng/mL — AB (ref 0.0–4.0)

## 2013-05-11 DIAGNOSIS — M87180 Osteonecrosis due to drugs, jaw: Secondary | ICD-10-CM | POA: Insufficient documentation

## 2013-05-30 ENCOUNTER — Ambulatory Visit: Payer: Self-pay | Admitting: Internal Medicine

## 2013-06-07 ENCOUNTER — Ambulatory Visit: Payer: Self-pay | Admitting: Internal Medicine

## 2013-06-14 ENCOUNTER — Ambulatory Visit: Payer: Self-pay | Admitting: Pain Medicine

## 2013-06-18 LAB — PSA: PSA: 75.6 ng/mL — ABNORMAL HIGH (ref 0.0–4.0)

## 2013-06-23 ENCOUNTER — Ambulatory Visit: Payer: Self-pay | Admitting: Pain Medicine

## 2013-06-23 LAB — MAGNESIUM: Magnesium: 1.7 mg/dL — ABNORMAL LOW

## 2013-06-23 LAB — SEDIMENTATION RATE: Erythrocyte Sed Rate: 14 mm/hr (ref 0–20)

## 2013-06-27 ENCOUNTER — Ambulatory Visit: Payer: Self-pay | Admitting: Internal Medicine

## 2013-06-28 ENCOUNTER — Ambulatory Visit: Payer: Self-pay | Admitting: Pain Medicine

## 2013-06-29 ENCOUNTER — Ambulatory Visit: Payer: Self-pay | Admitting: Internal Medicine

## 2013-07-02 LAB — CBC CANCER CENTER
BASOS ABS: 0 x10 3/mm (ref 0.0–0.1)
Basophil %: 0.5 %
EOS ABS: 0.1 x10 3/mm (ref 0.0–0.7)
Eosinophil %: 1.1 %
HCT: 37.8 % — AB (ref 40.0–52.0)
HGB: 12.4 g/dL — AB (ref 13.0–18.0)
LYMPHS PCT: 8.2 %
Lymphocyte #: 0.6 x10 3/mm — ABNORMAL LOW (ref 1.0–3.6)
MCH: 31.6 pg (ref 26.0–34.0)
MCHC: 32.8 g/dL (ref 32.0–36.0)
MCV: 96 fL (ref 80–100)
Monocyte #: 0.6 x10 3/mm (ref 0.2–1.0)
Monocyte %: 8.4 %
NEUTROS PCT: 81.8 %
Neutrophil #: 5.7 x10 3/mm (ref 1.4–6.5)
Platelet: 147 x10 3/mm — ABNORMAL LOW (ref 150–440)
RBC: 3.93 10*6/uL — ABNORMAL LOW (ref 4.40–5.90)
RDW: 13.8 % (ref 11.5–14.5)
WBC: 7 x10 3/mm (ref 3.8–10.6)

## 2013-07-28 ENCOUNTER — Ambulatory Visit: Payer: Self-pay | Admitting: Internal Medicine

## 2013-07-29 LAB — CBC CANCER CENTER
BASOS ABS: 0 x10 3/mm (ref 0.0–0.1)
Basophil %: 0.2 %
EOS ABS: 0.1 x10 3/mm (ref 0.0–0.7)
Eosinophil %: 0.7 %
HCT: 37.7 % — ABNORMAL LOW (ref 40.0–52.0)
HGB: 12.7 g/dL — ABNORMAL LOW (ref 13.0–18.0)
LYMPHS ABS: 0.4 x10 3/mm — AB (ref 1.0–3.6)
Lymphocyte %: 6.3 %
MCH: 32.5 pg (ref 26.0–34.0)
MCHC: 33.6 g/dL (ref 32.0–36.0)
MCV: 97 fL (ref 80–100)
Monocyte #: 0.6 x10 3/mm (ref 0.2–1.0)
Monocyte %: 8.3 %
NEUTROS ABS: 6 x10 3/mm (ref 1.4–6.5)
NEUTROS PCT: 84.5 %
PLATELETS: 158 x10 3/mm (ref 150–440)
RBC: 3.9 10*6/uL — ABNORMAL LOW (ref 4.40–5.90)
RDW: 13.7 % (ref 11.5–14.5)
WBC: 7.1 x10 3/mm (ref 3.8–10.6)

## 2013-07-29 LAB — COMPREHENSIVE METABOLIC PANEL
ANION GAP: 9 (ref 7–16)
AST: 19 U/L (ref 15–37)
Albumin: 3.7 g/dL (ref 3.4–5.0)
Alkaline Phosphatase: 63 U/L
BUN: 8 mg/dL (ref 7–18)
Bilirubin,Total: 0.4 mg/dL (ref 0.2–1.0)
CHLORIDE: 102 mmol/L (ref 98–107)
Calcium, Total: 9.4 mg/dL (ref 8.5–10.1)
Co2: 30 mmol/L (ref 21–32)
Creatinine: 0.86 mg/dL (ref 0.60–1.30)
EGFR (Non-African Amer.): >60
GLUCOSE: 100 mg/dL — AB (ref 65–99)
Osmolality: 280 (ref 275–301)
POTASSIUM: 3.3 mmol/L — AB (ref 3.5–5.1)
SGPT (ALT): 31 U/L (ref 12–78)
Sodium: 141 mmol/L (ref 136–145)
Total Protein: 6.9 g/dL (ref 6.4–8.2)

## 2013-07-29 LAB — MAGNESIUM: Magnesium: 2 mg/dL

## 2013-07-29 LAB — BILIRUBIN, DIRECT: BILIRUBIN DIRECT: 0.1 mg/dL (ref 0.00–0.20)

## 2013-07-30 LAB — PSA: PSA: 115.8 ng/mL — AB (ref 0.0–4.0)

## 2013-08-27 ENCOUNTER — Ambulatory Visit: Payer: Self-pay | Admitting: Internal Medicine

## 2013-09-08 LAB — CBC CANCER CENTER
BASOS PCT: 0.3 %
Basophil #: 0 x10 3/mm (ref 0.0–0.1)
EOS ABS: 0.1 x10 3/mm (ref 0.0–0.7)
Eosinophil %: 1 %
HCT: 36.2 % — ABNORMAL LOW (ref 40.0–52.0)
HGB: 12.4 g/dL — AB (ref 13.0–18.0)
LYMPHS PCT: 5.1 %
Lymphocyte #: 0.4 x10 3/mm — ABNORMAL LOW (ref 1.0–3.6)
MCH: 32.8 pg (ref 26.0–34.0)
MCHC: 34.2 g/dL (ref 32.0–36.0)
MCV: 96 fL (ref 80–100)
MONO ABS: 0.6 x10 3/mm (ref 0.2–1.0)
Monocyte %: 8 %
Neutrophil #: 7 x10 3/mm — ABNORMAL HIGH (ref 1.4–6.5)
Neutrophil %: 85.6 %
PLATELETS: 150 x10 3/mm (ref 150–440)
RBC: 3.76 10*6/uL — AB (ref 4.40–5.90)
RDW: 13.8 % (ref 11.5–14.5)
WBC: 8.1 x10 3/mm (ref 3.8–10.6)

## 2013-09-27 ENCOUNTER — Ambulatory Visit: Payer: Self-pay | Admitting: Internal Medicine

## 2013-10-06 LAB — BASIC METABOLIC PANEL
Anion Gap: 8 (ref 7–16)
BUN: 13 mg/dL (ref 7–18)
CO2: 29 mmol/L (ref 21–32)
CREATININE: 0.98 mg/dL (ref 0.60–1.30)
Calcium, Total: 8.8 mg/dL (ref 8.5–10.1)
Chloride: 103 mmol/L (ref 98–107)
EGFR (Non-African Amer.): >60
GLUCOSE: 116 mg/dL — AB (ref 65–99)
OSMOLALITY: 280 (ref 275–301)
Potassium: 3.7 mmol/L (ref 3.5–5.1)
Sodium: 140 mmol/L (ref 136–145)

## 2013-10-06 LAB — CBC CANCER CENTER
Basophil #: 0 x10 3/mm (ref 0.0–0.1)
Basophil %: 0.1 %
EOS ABS: 0 x10 3/mm (ref 0.0–0.7)
EOS PCT: 0.8 %
HCT: 32.9 % — ABNORMAL LOW (ref 40.0–52.0)
HGB: 11.1 g/dL — AB (ref 13.0–18.0)
Lymphocyte #: 0.4 x10 3/mm — ABNORMAL LOW (ref 1.0–3.6)
Lymphocyte %: 8.3 %
MCH: 33 pg (ref 26.0–34.0)
MCHC: 33.8 g/dL (ref 32.0–36.0)
MCV: 98 fL (ref 80–100)
MONOS PCT: 10.5 %
Monocyte #: 0.5 x10 3/mm (ref 0.2–1.0)
Neutrophil #: 4.2 x10 3/mm (ref 1.4–6.5)
Neutrophil %: 80.3 %
Platelet: 179 x10 3/mm (ref 150–440)
RBC: 3.37 10*6/uL — ABNORMAL LOW (ref 4.40–5.90)
RDW: 14.1 % (ref 11.5–14.5)
WBC: 5.2 x10 3/mm (ref 3.8–10.6)

## 2013-10-06 LAB — HEPATIC FUNCTION PANEL A (ARMC)
Albumin: 3.6 g/dL (ref 3.4–5.0)
Alkaline Phosphatase: 65 U/L
BILIRUBIN TOTAL: 0.5 mg/dL (ref 0.2–1.0)
Bilirubin, Direct: 0.1 mg/dL (ref 0.00–0.20)
SGOT(AST): 14 U/L — ABNORMAL LOW (ref 15–37)
SGPT (ALT): 26 U/L (ref 12–78)
Total Protein: 6.6 g/dL (ref 6.4–8.2)

## 2013-10-07 LAB — PSA: PSA: 148.9 ng/mL — ABNORMAL HIGH (ref 0.0–4.0)

## 2013-10-08 ENCOUNTER — Ambulatory Visit: Payer: Self-pay | Admitting: Pain Medicine

## 2013-10-27 ENCOUNTER — Ambulatory Visit: Payer: Self-pay | Admitting: Internal Medicine

## 2013-11-26 LAB — CBC CANCER CENTER
Basophil #: 0 x10 3/mm (ref 0.0–0.1)
Basophil %: 0.4 %
Eosinophil #: 0.1 x10 3/mm (ref 0.0–0.7)
Eosinophil %: 0.9 %
HCT: 37.1 % — ABNORMAL LOW (ref 40.0–52.0)
HGB: 12.4 g/dL — ABNORMAL LOW (ref 13.0–18.0)
LYMPHS ABS: 0.5 x10 3/mm — AB (ref 1.0–3.6)
Lymphocyte %: 5.6 %
MCH: 33.1 pg (ref 26.0–34.0)
MCHC: 33.5 g/dL (ref 32.0–36.0)
MCV: 99 fL (ref 80–100)
MONOS PCT: 7.8 %
Monocyte #: 0.7 x10 3/mm (ref 0.2–1.0)
NEUTROS PCT: 85.3 %
Neutrophil #: 7.4 x10 3/mm — ABNORMAL HIGH (ref 1.4–6.5)
PLATELETS: 156 x10 3/mm (ref 150–440)
RBC: 3.76 10*6/uL — AB (ref 4.40–5.90)
RDW: 15.5 % — AB (ref 11.5–14.5)
WBC: 8.7 x10 3/mm (ref 3.8–10.6)

## 2013-11-27 ENCOUNTER — Ambulatory Visit: Payer: Self-pay | Admitting: Internal Medicine

## 2013-12-28 ENCOUNTER — Ambulatory Visit: Payer: Self-pay | Admitting: Internal Medicine

## 2014-01-13 LAB — CALCIUM: CALCIUM: 9.2 mg/dL (ref 8.5–10.1)

## 2014-01-13 LAB — CBC CANCER CENTER
BASOS ABS: 0 x10 3/mm (ref 0.0–0.1)
BASOS PCT: 0.2 %
EOS ABS: 0.1 x10 3/mm (ref 0.0–0.7)
EOS PCT: 0.8 %
HCT: 34.8 % — ABNORMAL LOW (ref 40.0–52.0)
HGB: 11.6 g/dL — ABNORMAL LOW (ref 13.0–18.0)
Lymphocyte #: 0.8 x10 3/mm — ABNORMAL LOW (ref 1.0–3.6)
Lymphocyte %: 10.6 %
MCH: 32.1 pg (ref 26.0–34.0)
MCHC: 33.4 g/dL (ref 32.0–36.0)
MCV: 96 fL (ref 80–100)
Monocyte #: 0.5 x10 3/mm (ref 0.2–1.0)
Monocyte %: 7.4 %
NEUTROS ABS: 5.9 x10 3/mm (ref 1.4–6.5)
Neutrophil %: 81 %
Platelet: 203 x10 3/mm (ref 150–440)
RBC: 3.62 10*6/uL — ABNORMAL LOW (ref 4.40–5.90)
RDW: 15.1 % — ABNORMAL HIGH (ref 11.5–14.5)
WBC: 7.3 x10 3/mm (ref 3.8–10.6)

## 2014-01-13 LAB — CREATININE, SERUM
CREATININE: 0.71 mg/dL (ref 0.60–1.30)
EGFR (African American): >60

## 2014-01-17 LAB — PSA: PSA: 318.6 ng/mL — AB (ref 0.0–4.0)

## 2014-01-27 ENCOUNTER — Ambulatory Visit: Payer: Self-pay | Admitting: Internal Medicine

## 2014-01-27 LAB — CBC CANCER CENTER
BASOS PCT: 0.1 %
Basophil #: 0 x10 3/mm (ref 0.0–0.1)
EOS ABS: 0.2 x10 3/mm (ref 0.0–0.7)
Eosinophil %: 2.4 %
HCT: 35.2 % — AB (ref 40.0–52.0)
HGB: 11.7 g/dL — AB (ref 13.0–18.0)
Lymphocyte #: 0.5 x10 3/mm — ABNORMAL LOW (ref 1.0–3.6)
Lymphocyte %: 6.4 %
MCH: 31.6 pg (ref 26.0–34.0)
MCHC: 33.2 g/dL (ref 32.0–36.0)
MCV: 95 fL (ref 80–100)
Monocyte #: 0.5 x10 3/mm (ref 0.2–1.0)
Monocyte %: 6.9 %
NEUTROS ABS: 6.5 x10 3/mm (ref 1.4–6.5)
Neutrophil %: 84.2 %
Platelet: 159 x10 3/mm (ref 150–440)
RBC: 3.69 10*6/uL — AB (ref 4.40–5.90)
RDW: 15 % — AB (ref 11.5–14.5)
WBC: 7.8 x10 3/mm (ref 3.8–10.6)

## 2014-01-28 ENCOUNTER — Ambulatory Visit: Payer: Self-pay | Admitting: Pain Medicine

## 2014-02-27 ENCOUNTER — Ambulatory Visit: Payer: Self-pay | Admitting: Internal Medicine

## 2014-03-29 ENCOUNTER — Ambulatory Visit: Payer: Self-pay | Admitting: Internal Medicine

## 2014-04-07 LAB — CBC CANCER CENTER
BASOS PCT: 0.2 %
Basophil #: 0 x10 3/mm (ref 0.0–0.1)
Eosinophil #: 0.1 x10 3/mm (ref 0.0–0.7)
Eosinophil %: 0.8 %
HCT: 35.1 % — ABNORMAL LOW (ref 40.0–52.0)
HGB: 11.6 g/dL — ABNORMAL LOW (ref 13.0–18.0)
LYMPHS ABS: 0.6 x10 3/mm — AB (ref 1.0–3.6)
LYMPHS PCT: 9 %
MCH: 32.2 pg (ref 26.0–34.0)
MCHC: 33 g/dL (ref 32.0–36.0)
MCV: 98 fL (ref 80–100)
Monocyte #: 0.6 x10 3/mm (ref 0.2–1.0)
Monocyte %: 8.3 %
NEUTROS ABS: 5.8 x10 3/mm (ref 1.4–6.5)
NEUTROS PCT: 81.7 %
PLATELETS: 153 x10 3/mm (ref 150–440)
RBC: 3.6 10*6/uL — AB (ref 4.40–5.90)
RDW: 17.7 % — AB (ref 11.5–14.5)
WBC: 7.1 x10 3/mm (ref 3.8–10.6)

## 2014-04-07 LAB — CALCIUM: CALCIUM: 9.2 mg/dL (ref 8.5–10.1)

## 2014-04-07 LAB — CREATININE, SERUM
Creatinine: 0.83 mg/dL (ref 0.60–1.30)
EGFR (African American): >60

## 2014-04-07 LAB — HEPATIC FUNCTION PANEL A (ARMC)
ALT: 25 U/L
Albumin: 3.5 g/dL (ref 3.4–5.0)
Alkaline Phosphatase: 99 U/L
BILIRUBIN TOTAL: 0.4 mg/dL (ref 0.2–1.0)
Bilirubin, Direct: 0.1 mg/dL (ref 0.0–0.2)
SGOT(AST): 17 U/L (ref 15–37)
TOTAL PROTEIN: 6.6 g/dL (ref 6.4–8.2)

## 2014-04-08 LAB — PSA: PSA: 405.2 ng/mL — ABNORMAL HIGH (ref 0.0–4.0)

## 2014-04-29 ENCOUNTER — Ambulatory Visit: Payer: Self-pay | Admitting: Internal Medicine

## 2014-06-30 ENCOUNTER — Ambulatory Visit
Admit: 2014-06-30 | Disposition: A | Discharge status: Home / Self Care | Payer: Self-pay | Attending: Internal Medicine | Admitting: Internal Medicine

## 2014-07-29 ENCOUNTER — Ambulatory Visit
Admit: 2014-07-29 | Disposition: A | Discharge status: Home / Self Care | Payer: Self-pay | Attending: Internal Medicine | Admitting: Internal Medicine

## 2014-08-19 NOTE — Consult Note (Signed)
Brief Consult Note: Diagnosis: Left facial cellulitis with history of left mandibular osteonecrosis.   Patient was seen by consultant.   Consult note dictated.   Recommend further assessment or treatment.   Discussed with Attending MD.   Comments: 74 y.o. male with history of left mandibular osteonecrosis and diffuse metastatic prostate cancer presented to ED with 3-4 day history of left sided facial swelling.  No respiratory issues, but significant pain.  PE Face- diffuse swelling of left mandbile medial and lateral aspects, anterior floor of mouth edema L>R, but no signficant posterior edema, significant left maxillary/zygomatic swelling, no fluctuance, erythema extending down to sternal notch.  Larygnoscopy- No airway involvement, base of tongue normal  Impression-  history of osteonecrosis of left mandible now with left sided floor of mouth and facial cellulitis  Plan-  Agree with Unasyn or Clindamycin.  Would increase Decadron to '10mg'$  Q 8 and then taper down to ensure improvement of floor of mouth edema.  Will continue to follow.  Electronic Signatures: Tarren Velardi, Shela Leff (MD)  (Signed [phone] 17:50)  Authored: Brief Consult Note   Last Updated: 13-Nov-14 17:50 by Pascal Lux (MD)

## 2014-08-19 NOTE — Consult Note (Signed)
Chief Complaint:  Subjective/Chief Complaint Improved pain this a.m.  No respiratory issues.  Patient reports decreased swelling.   Brief Assessment:  GEN well developed, well nourished, no acute distress   Cardiac Regular   Respiratory normal resp effort   Additional Physical Exam HEENT- left facial induration and swelling improving, resolution of firmness in submandibular region as well as over left zygoma, now centered over left mandibular ramus where previous osteonecrosis was.  Oral cavity with improved floor of mouth edema in firmness as well as extent.  Improved lip edema as well.  Decreased erythema over anterior neck and sternum.   Lab Results: Routine Chem:  14-Nov-14 04:05   Glucose, Serum  138  BUN 11  Creatinine (comp)  0.58  Sodium, Serum 139  Potassium, Serum  3.0  Chloride, Serum 106  CO2, Serum 30  Calcium (Total), Serum  7.9  Anion Gap  3  Osmolality (calc) 279  eGFR (African American) >60  eGFR (Non-African American) >60 (eGFR values <66m/min/1.73 m2 may be an indication of chronic kidney disease (CKD). Calculated eGFR is useful in patients with stable renal function. The eGFR calculation will not be reliable in acutely ill patients when serum creatinine is changing rapidly. It is not useful in  patients on dialysis. The eGFR calculation may not be applicable to patients at the low and high extremes of body sizes, pregnant women, and vegetarians.)  Routine Hem:  14-Nov-14 04:05   WBC (CBC)  11.4  RBC (CBC)  3.54  Hemoglobin (CBC)  11.6  Hematocrit (CBC)  34.0  Platelet Count (CBC) 162  MCV 96  MCH 32.9  MCHC 34.3  RDW 14.5  Neutrophil % 93.3  Lymphocyte % 1.8  Monocyte % 4.8  Eosinophil % 0.0  Basophil % 0.1  Neutrophil #  10.7  Lymphocyte #  0.2  Monocyte # 0.5  Eosinophil # 0.0  Basophil # 0.0 (Result(s) reported on 12 Mar 2013 at 05:00AM.)   Assessment/Plan:  Invasive Device Daily Assessment of Necessity:  Does the patient currently  have any of the following indwelling devices? none   Assessment/Plan:  Assessment Cellulitis of face possibly originating from osteonecrosis from mandible   Plan 1)  Significant improvement, continue IV abx, consider switching to oral clindamycin 300 QID prior discharge 2)  [facility]ing [facility]   Electronic Signatures: Weltha Cathy, CShela Leff(MD)  (Signed [id number]:[phone])  Authored: Chief Complaint, Brief Assessment, Lab Results, Assessment/Plan   Last Updated: 14-Nov-14 07:10 by VPascal Lux(MD)

## 2014-08-19 NOTE — Consult Note (Signed)
PATIENT NAME:  Dustin Frazier, Dustin Frazier MR#:  [id number] DATE OF BIRTH:  [birth year]  DATE OF CONSULTATION:  03/11/2013  REFERRING PHYSICIAN:  Ferman Hamming, MD CONSULTING PHYSICIAN:  Jerene Bears, MD  REASON FOR CONSULTATION: Facial infection.   HISTORY OF PRESENT ILLNESS: The patient is a 74 year old gentleman who presents today for evaluation of the left side of his face. He has a history of metastatic prostate cancer and has had a lesion in his left anterior mandible thought to possibly be osteonecrosis secondary to Zometa. The patient developed left-sided facial pain that I thought was a sinus infection 4 days ago, and then over the last 3 days, this has significantly increased in size. The patient took quite a bit of over-the-counter Goody's, Motrin, and ibuprofen prior to onset. The patient reports left-sided facial pain pressure. He denies any significant purulent drainage. He denies any respiratory issues. He does report that he has some problems and numbness in his lip.   PAST MEDICAL HISTORY: Significant for hypertension and prostate cancer, chronic back pain.   PAST SURGICAL HISTORY: None on the head and neck.   SOCIAL HISTORY: A greater than 30-pack-year history of smoking. No significant alcohol use.   ALLERGIES: No known drug allergies.   CURRENT MEDICATIONS: Xtandi, prednisone, multivitamin, Klor-Con, hydrochlorothiazide, doxazosin, Os-Cal D, and aspirin.   PHYSICAL EXAMINATION:  VITAL SIGNS: Temperature is 98.9. Pulse 99, respirations 18, blood pressure 123/73. Pulse oximetry is 98.  GENERAL: A well-nourished, well-developed male in no acute distress.  EARS: EACs are clear bilaterally. TMs are tight. No perforation or effusion.  NOSE: Clear to anterior examination. No mucopus or polyps. Oral cavity and oropharynx: Diffuse edema and erythema of the left oral cavity with pinpoint tenderness and swelling surrounding the mandible just adjacent to a tooth of the left lower  mandible. He has a floor of mouth firmness on the anterior floor of mouth posteriorly floor of mouth. Base of tongue is soft and supple and the right floor of mouth is soft and supple. The left lateral aspect of the mandible was widened and erythematous and inflamed. No obvious fluid collection or purulent drainage seen. The left check and masseter as well as extending all the way up to the zygoma, is swollen and inflamed. This extends down to the submandibular region. He has a diffuse area of erythema along his left anterior neck down below the sternal notch and extending over to the right side.   PROCEDURE: Transnasal flexible laryngoscopy.   PREPROCEDURE DIAGNOSIS: Facial infection.   POSTPROCEDURE DIAGNOSIS: Facial infection.  DESCRIPTION OF PROCEDURE: Verbal consent was obtained. The patient's nasal cavity __________ lidocaine transnasal flexible laryngoscopy was performed. This demonstrated no significant abnormal masses or lesions to the patient's nasal cavity, nasopharynx, pharynx or larynx. Base of tongue is normal in appearance. Lateral pharyngeal wall normal in appearance bilaterally. Two vocal folds are mobile bilaterally, and vallecula is clear. Epiglottis is clear. Two  vocal folds are clear. Postcricoid and interarytenoid area is clear.   IMPRESSION: Left-sided facial swallowing with no evidence of airway compromise. CT scan is reviewed which demonstrates a left-sided soft-tissue swelling, left mandible, face, but no obvious osteomyelitis was seen. Previous MRI scan from February 2014 is reviewed which is consistent with osteomyelitis or osteonecrosis of the bone. Bone scan from 2013 was reviewed which reveals a hypermetabolic process of the left anterior mandible.   IMPRESSION: Left-sided facial swelling, cellulitis, most likely originating from either a poor dentition of the left mandible or a previous history of  osteonecrosis of the left mandible, although on most recent CT scan, there  is no obvious necrosis identified. The patient has no history of osteonecrosis of the left anterior mandible as well as increased hypermetabolic activity on bone scan as well in that area.   I recommend IV antibiotics and would consider clindamycin and agree with IV oral steroids. The patient's airway is widely patent and there is no obvious lesion that needs to be drained and I will continue to follow this patient to ensure therapeutic improvement.   ____________________________ Jerene Bears, MD ccv:np D: 03/11/2013 17:45:42 ET T: 03/11/2013 22:22:59 ET JOB#: [id number]  cc: Jerene Bears, MD, <Dictator>  Jerene Bears MD ELECTRONICALLY SIGNED 03/12/2013 7:19

## 2014-08-19 NOTE — Discharge Summary (Signed)
PATIENT NAME:  Dustin Frazier, Dustin Frazier MR#:  [id number] DATE OF BIRTH:  February 19, 1941  DATE OF ADMISSION:  03/11/2013 DATE OF DISCHARGE:  03/13/2013  DISCHARGE DIAGNOSES: 1.  Recurrent left facial cellulitis.  2.  Prostate cancer with metastases.  3.  Chronic intermittent right bundle branch block on EKG.  4.  Hypertension.  5.  Hypokalemia.  6.  Tobacco abuse.   CONSULTANTS: Dr. Pryor Ochoa of ENT.   IMAGING STUDIES: Include a CT scan of the neck without contrast showed facial swelling, no abscess. Also findings consistent with widespread bony metastatic disease, also lymphadenopathy.   ADMITTING HISTORY AND PHYSICAL AND HOSPITAL COURSE: Please see detailed H and P dictated by Dr. Posey Pronto. In brief, a 74 year old male patient presented to the hospital complaining of left-sided jaw pain and swelling. The patient was found to have cellulitis and admitted to the hospitalist service, started on IV antibiotics and steroids.   ENT saw the patient and suggested discharging patient home on clindamycin. The patient will be discharged back home to follow up with his oral surgeon at [facility]. The patient will also follow up with Dr. Ma Hillock, as he has widespread bony metastasis from his prostate cancer. His prostate cancer medicine was continued during the hospital stay.   Incidental finding of right bundle branch block on the EKG of the patient. He did not have any chest pain, shortness of breath. Will follow up with his primary care physician.   For his hypokalemia, this was replaced, continued on home dose of potassium.   Prior to discharge, the patient still has swelling on the left side of the jaw with significant improvement in swelling, redness, no trouble swallowing or chewing food.   DISCHARGE MEDICATIONS: Include:  1.  Hydrochlorothiazide lisinopril 12.5/10 mg oral once a day.  2.  Aspirin 81 mg a day.  3.  Xtandi 40 mg 4 capsules oral once a day.  4.  Potassium chloride 10 mEq daily.  5.  Calcium  vitamin D 1 tablet daily.  6.  Multivitamin daily.  7.  Doxazosin 2 mg oral daily.  8.  Acetaminophen oxycodone 325/5, 1 tablet oral every four hours as needed for pain, 30 pills given.  9.  Clindamycin 300 mg oral 3 times a day.  10.  Prednisone 60 mg tapered over six days.   DISCHARGE INSTRUCTIONS: Low-sodium, regular consistency diet. Activity as tolerated. Follow up with oral surgery at [facility] in 1 to 2 weeks and Dr. Ma Hillock and primary care physician in 1 to 2 weeks.   TIME SPENT ON DAY OF DISCHARGE IN DISCHARGE ACTIVITY: 40 minutes.    ____________________________ Leia Alf Everlena Mackley, MD srs:cg D: 03/15/2013 02:29:19 ET T: 03/15/2013 03:04:53 ET JOB#: [id number]  cc: Alveta Heimlich R. Abbie Berling, MD, <Dictator> Sandeep R. Ma Hillock, [place] MD ELECTRONICALLY SIGNED 03/15/2013 20:48

## 2014-08-19 NOTE — H&P (Signed)
PATIENT NAME:  Dustin Frazier, QUEST MR#:  [id number] DATE OF BIRTH:  03/26/1941  DATE OF ADMISSION:  03/11/2013  PRIMARY CARE PROVIDER: Dr. Calla Kicks.   EMERGENCY DEPARTMENT REFERRING PHYSICIAN: Dr. Cinda Quest.   CHIEF COMPLAINT: Left-sided facial swelling.   HISTORY OF PRESENT ILLNESS: The patient is a 74 year old white male with diffuse metastatic prostate cancer who has had somewhat abnormality of his jaw bone and there was some concern for osteonecrosis versus metastatic disease since he was on Zometa. He was seen by oral facial surgery at [facility] and they recommended continuing Zometa. The patient reports that he has had a history of having left-sided facial cellulitis which has been treated with outpatient oral antibiotics. He states that about last week, he developed a sinus infection and then subsequently started having swelling of his left face with warmth; therefore, he came to the ED and now he has significant swelling involving his left face. Had a CT scan of the face which did not show any abscess. The ED PA spoke to Dr. Pryor Ochoa who is on call. He will see the patient.   The patient also reports that he is having difficulties with swallowing solids. He is having some breathing difficulties. He denies any chest pains. No nausea, vomiting or diarrhea. Denies any urinary frequency, urgency or hesitancy. He does have some chronic back pain.   PAST MEDICAL HISTORY:  1.  Stage IV metastatic prostate cancer with rising PSA on hormonal therapy, diagnosed January 2008.  2.  Bone scan shows multiple metastatic bone disease.  3.  History of hypertension.  4.  History of nicotine addiction.   ALLERGIES: None.   MEDICATIONS: Aspirin 81 mg one tab p.o. daily, calcium plus vitamin D 1 tab p.o. b.i.d., doxazosin 2 mg daily, hydrochlorothiazide and lisinopril 12.5/10 daily, Klor-Con 10 mEq daily, multivitamin daily, prednisone 5 mg b.i.d., Xtandi 40 mg four capsules daily.   SOCIAL HISTORY: Smokes 1 pack per  day. Does drink beer every few days.   FAMILY HISTORY: No history of prostate cancer.   REVIEW OF SYSTEMS: CONSTITUTIONAL: Denies any fevers, fatigue. No weakness. Complains of some pain in the face. No weight loss. No weight gain.  EYES: No blurred or double vision. No pain. No redness. No inflammation. No glaucoma. No cataracts.  ENT: No tinnitus. No ear pain. No hearing loss. No seasonal or year-round allergies. No epistaxis. No nasal discharge. Complains of difficulty swallowing since the swelling on the face.  RESPIRATORY: Denies any cough, wheezing, hemoptysis.  CARDIOVASCULAR: Denies any chest pain, orthopnea, edema.  GASTROINTESTINAL: No nausea, vomiting, diarrhea. No abdominal pain. No hematemesis.  GENITOURINARY: Denies any dysuria, hematuria, renal calculus.  ENDOCRINE: Denies any polyuria, nocturia or thyroid problems.  HEMOLYMPHATIC: Denies anemia, easy bruisability or bleeding.  SKIN: No acne. No rash.  SKIN: Complains of swelling in the face on the left.  MUSCULOSKELETAL: Has chronic low back pain.  NEUROLOGIC: No numbness. No CVA. No TIA.  PSYCHIATRIC: No anxiety. No insomnia.   PHYSICAL EXAMINATION:  VITAL SIGNS: Temperature 98.9, pulse 99, respirations 18, blood pressure 123/73.  GENERAL: A well-developed male in no acute distress.  HEENT: Head atraumatic, normocephalic. Pupils equally round, reactive to light and accommodation. There is no conjunctival pallor. Nasal exam shows no drainage or ulceration.  OROPHARYNX: Limited visualization of his oropharynx due to significant swelling. Left face is swollen up to his cheek. There is some warmth and erythema.  NECK: Also, there is some erythema and swelling.  RESPIRATORY: Clear to auscultation bilaterally  without any rales, rhonchi, or wheezing.  CARDIOVASCULAR: Regular rate and rhythm. No murmurs, rubs, clicks, gallops. PMI is not displaced.  ABDOMEN: Soft, nontender, nondistended. Positive bowel sounds x4.  EXTREMITIES:  No clubbing, cyanosis, or edema.  SKIN: There is some erythema involving his left face as described above.  NEUROLOGIC: Awake, alert, oriented x3. No focal deficits.  LYMPHATICS: No lymph nodes palpable.  VASCULAR: Good DP, PT pulses.  PSYCHIATRIC: Not anxious or depressed.  NEUROLOGIC: Awake, alert, and oriented x3. No focal deficits.   LABORATORY DATA: Glucose 96, BUN 10, creatinine 0.75, sodium 137, potassium 2.3, chloride 100. CO2 is 33. Calcium 8.9. LFTs: Total protein 7, albumin 3.3, bilirubin total 0.8, alk phos 89. WBC 12.6, hemoglobin 13.3. Platelet count is 199.   Maxillofacial CT of the neck with contrast shows poorly differentiated abnormal soft tissue swelling along the left aspect of the mandible and face extending inferiorly deeply into the left tongue and left tonsillar region. There is no abscess.   ASSESSMENT AND PLAN: The patient is a 74 year old with metastatic prostate cancer with recurrent facial cellulitis who presents with significant left-sided facial swelling.  1.  Recurrent left-sided facial cellulitis. At this time, we will treat him with IV vancomycin and Unasyn. We will get blood cultures. We will get place him on IV Decadron and then Ear, Nose, and Throat will see him later today.  2.  Prostate cancer with metastatic disease. He is on prednisone and the chemotherapy regimen which we will continue. 3.  Hypertension. We will hold his antihypertensives.  4.  Severe hypokalemia, likely due to hydrochlorothiazide therapy. I will give him IV potassium chloride due the patient likely unable to swallow pill. Will repeat another potassium after replacement. Also check a magnesium.  5.  Nicotine addiction. The patient was counseled regarding smoking cessation, 4 minutes spent. Nicotine patch will be offered and given to the patient.   Fifty-five minutes spent.    ____________________________ Lafonda Mosses. Posey Pronto, MD shp:np D: 03/11/2013 17:38:08 ET T: 03/11/2013 20:19:57  ET JOB#: [id number]  cc: Trashawn Oquendo H. Posey Pronto, MD, <Dictator> Alric Seton MD ELECTRONICALLY SIGNED 03/12/2013 16:58

## 2014-08-20 NOTE — Consult Note (Signed)
Reason for Visit: This 74 year old Male patient presents to the clinic for initial evaluation of  stage IV prostate cancer .   Referred by Dr. Ma Hillock.  Diagnosis:  Chief Complaint/Diagnosis   26-year-old male with hormone refractory prostate cancer progressive disease and widespread bony metastasis on narcotic analgesicsfor consideration of opinion regarding Xofigo treatment  Imaging Report bone scan and serial plain films and CT scans reviewed   Referral Report clinical notes reviewed   Planned Treatment Regimen Lorella Nimrod   HPI   patient is a 74 year old male well known to our Department having been treated back in December of 2011 for metastatic prostate cancer. He was treated to his thoracic spine and has done well from a pain tolerance in that region.his initial Gleason score was 7 (4+3)with perineural invasion present.patient also had bone metastasis at presentation of his prostate cancer. November 2000 of his PSA jumped to 208 bone scan showed progressive metastatic disease with rib lesions and vertebral disease. He was started on Zytigaand prednisone in December 2011. In December 2013 bone scan worsened was abnormal localization in his jaw went on to have osteomyelitis and oral surgery at [facility]. He was switched toXgeva.PSA has continued to rise he was evaluated at [facility] and recommendation for xofigo radium 223 was made. He is seen today for opinion. He is doing fairly well blood counts have remained stable by mouth intake is good does have some left shoulder pain and is on Vicodin when necessary for his bone pain.  Past Hx:    Bone Met Spine:    prostate ca:    HTN:   Past, Family and Social History:  Past Medical History positive   Cardiovascular hypertension   Genitourinary kidney stones   Family History positive   Family History Comments father died of an MI mother and CVA at age 68 no family history of cancer   Social History positive   Social History  Comments 40-pack-year smoking history quit smoking about 2 months prior, social EtOH use history   Allergies:   No Known Allergies:   Home Meds:  Home Medications: Medication Instructions Status  hydrochlorothiazide-lisinopril 12.5 mg-10 mg tablet 1 tab(s) orally once a day  Active  Cardura 2 mg oral tablet 1 tab(s) orally once a day Active  Norco 325 mg-5 mg oral tablet 1 tab(s) orally every 6 hours, As Needed - for Pain Active  Zytiga 250 mg oral tablet 4 tab(s) orally once a day Active  aspirin 81 mg oral tablet 1 tab(s) orally once a day Active  Co Q-10 100 mg oral capsule 1 cap(s) orally once a day Active  predniSONE 5 mg oral tablet 1 tab(s) orally 2 times a day Active  multivitamin 1 tab(s) orally once a day Active  Klor-Con 20 mEq oral tablet, extended release 1 tab(s) orally once a day Active   Review of Systems:  General negative   Performance Status (ECOG) 0   Skin negative   Breast negative   Ophthalmologic negative   ENMT negative   Cardiovascular negative   Gastrointestinal negative   Genitourinary see HPI   Musculoskeletal negative   Neurological negative   Psychiatric negative   Hematology/Lymphatics negative   Endocrine negative   Allergic/Immunologic negative   Review of Systems   review of systems obtained from nurse's notes.  Nursing Notes:  Nursing Vital Signs and Chemo Nursing Nursing Notes: *CC Vital Signs Flowsheet:   29-Jan-15 10:43  Temp Temperature 97.2  Pulse Pulse 83  Respirations Respirations 20  SBP SBP 146  DBP DBP 92  Pain Scale (0-10)  0  Current Weight (kg) (kg) 81.2   Physical Exam:  General/Skin/HEENT:  General normal   Skin normal   Eyes normal   ENMT normal   Head and Neck normal   Additional PE well-developed well-nourished male in NAD. Oral cavity is clear. He's recently is recovering from debridement of osteomyelitis of the left jaw. Lungs are clear to A&P cardiac examination shows regular rate and  rhythm abdomen is benign. Motor sensory N. DTR levels are equal and symmetric in the upper and lower extremities. Proprioception is intact. Range of motion of the upper or lower extremities does not elicit pain. Deep palpation of the spine does not elicit pain.   Breasts/Resp/CV/GI/GU:  Respiratory and Thorax normal   Cardiovascular normal   Gastrointestinal normal   Genitourinary normal   MS/Neuro/Psych/Lymph:  Musculoskeletal normal   Neurological normal   Lymphatics normal   Other Results:  Radiology Results: LabUnknown:    08-Oct-14 10:08, CT Abdomen and Pelvis With Contrast  PACS Image     08-Oct-14 13:18, Bone Scan Whole Body (Part 2 of 2)  PACS Image     13-Nov-14 15:06, CT Neck With Contrast  PACS Image   CT:    08-Oct-14 10:08, CT Abdomen and Pelvis With Contrast  CT Abdomen and Pelvis With Contrast   REASON FOR EXAM:    rising PSA prostate CA eval for progressive met   disease  COMMENTS:       PROCEDURE: KCT - KCT ABDOMEN/PELVIS W  - Feb 03 2013 10:08AM     RESULT: CT of the abdomen and pelvis is performed utilizing 100 mL of   Isovue-300 iodinated intravenous contrast and oral contrast. Images are   reconstructed at 3.0 mm slice thickness in the axial plane and compared   to the study of 12/04/2009.    The images through the base the lungs demonstrate no focal infiltrate,   mass, evidence of effusion or pneumothorax. There is decreased   attenuation of the hepatic parenchyma consistent with fatty infiltration.   The gallbladder is nondistended. There is fullness in the right adrenal     gland with nodular density superiorly best appreciated on image 27 to   image 30 which is unchanged measuring up to 2.6 cm with low-attenuation,   possibly an adenoma. There are hepatic cysts in the upper pole the left   kidney and in the lower to mid pole right kidney which appear unchanged.   There isatherosclerotic calcification without evidence of an aneurysm.   There  is no ascites or exsanguination. There is mild thickening of the   distal esophagus. Correlate for esophagitis. There is no retroperitoneal   mass or adenopathy evident. There is slight asymmetric fullness of the   prostate suggested greater to the left than the right. There is no   abnormal bowel distention or wall thickening. Stomach appears to be   nondistended and grossly unremarkable area punctate granulomatous   calcification is present in the spleen. The pancreas is unremarkable.   There is abnormal appearance of the skeletal system with multiple areas   of sclerotic lesions throughout the sacrum and, lumbar spine and lower   thoracic spine and in the pelvis and proximal femora consistent with     widespread prostate bony metastasis. This has been present previously.   There is mild fullness in the body of the left adrenal gland.  IMPRESSION:   1. Thickening of the wall of the distal esophagus. Correlate for   esophagitis.  2. Widespread bony metastasis. This has been present previously.  3. Adrenal enlargement which is unchanged.  4. Stable renal cysts.  5. Fatty infiltration of the liver.  6. Slight asymmetric fullness on the left side of the prostate. This does   not appear to be radically changed.    Dictation Site: 1    Verified By: Sundra Aland, M.D., MD    [phone] 15:06, CT Neck With Contrast  CT Neck With Contrast   REASON FOR EXAM:    abscess / cellulitis  COMMENTS:       PROCEDURE: CT  - CT NECK WITH CONTRAST  - Mar 11 2013  3:06PM     CLINICAL DATA:  Facial pain and swelling.  Marland Kitchen    EXAM:  CT NECK WITH CONTRAST    TECHNIQUE:  Multidetector CT imaging of the neck was performed using the  standard protocol following the bolus administration of intravenous  contrast.  CONTRAST:  75 cc of Isovue 370    COMPARISON:  None.    FINDINGS:  Axial CT scanning was performed through of the face and neck with  reconstructions at 2 mm intervals and slice  thicknesses. Coronal and  sagittal reconstructions were performed.    There is abnormal soft tissue swelling in the left malar region.  There is buccal swelling. There is ill-defined increased soft tissue  density along the external aspect of the left mandible. No lytic or  blastic mandibular lesion is demonstrated and there is no periosteal  reaction. There is increased density in the left aspect of the floor  of the mouth without evidence of a discrete well-defined abscess.  There is abnormal soft tissue density within the left maxillary  sinus but no definite air-fluid level is demonstrated. The left  tonsillar soft tissues are edematous. No abnormal soft tissue gas  collections are demonstrated. The airway does not appear  compromised. The jugular and carotid vessels appear normal. As best  as can be determined the submandibular glands are uninvolved though  mild edema of the left submandibular gland may be present. The  parotid glands appear normal. There are no bulky anterior or  posterior cervical lymph nodes.    At bone window settings there is reversal of the normal cervical  lordosis. There are multiple sclerotic foci within mid and lower  cervical vertebral bodies and upper thoracic vertebral bodies as  well as within the manubrium consistent with metastatic disease.  There is no vertebral body collapse and the prevertebral soft  tissues appear normal. The pulmonary apices exhibit mild  emphysematous changes but no masses or infiltrates. There is pleural  thickening posteriorly in the right upper hemi thorax.     IMPRESSION:  1. There is poorly defined abnormal soft tissue swelling associated  with the left aspect of the mandible and face and extending  inferiorly and deeply to involve the left tongue base and the left  tonsillar regions. The appearance suggests superficial and deep  cellulitis. A discrete drainable abscess is not demonstrated. There  is no CT evidence of  osteomyelitis. If infectious, the etiology may  be related to the patient's dentition, sinus disease, or  pharyngitis.  2. There are findings consistent with widespread bony metastatic  disease. The findings within the face and floor of the mouth and  parapharyngeal region are not felt to be due to infiltrative  malignancy though  this cannot be entirely excluded. There is no  bulky lymphadenopathy.  3. ENT evaluation is recommended.      Electronically Signed    By: David  Martinique    On: 03/11/2013 15:17         Verified By: DAVID A. Martinique, M.D., MD  Nuclear Med:    08-Oct-14 13:18, Bone Scan Whole Body (Part 2 of 2)  Bone Scan Whole Body (Part 2 of 2)   REASON FOR EXAM:    rising PSA prostate CA eval for progressive met   disease  COMMENTS:       PROCEDURE: KNM - KNM BONE WB 3HR 2 OF 2  - Feb 03 2013  1:18PM     RESULT: History: Rising PSA    Comparison: None.    Radiopharmaceutical: 23.177 mCi Tc-69mMDP administered intravenously.    Technique:  Delayed, whole-body planar images were obtained in the   anterior and posterior projections.    Findings:      There are numerous foci of increased rate tracer uptake in the right   proximal humerus, left humerus, right glenoid, left glenoid, cervical   spine, thoracic spine, lumbar spine, pelvis, proximal left femur, left   superior pubic ramus, and multiple bilateral ribs. There is increased   radiotracer uptake involving the medial right midfootlikely reflecting   degenerative change.    Normal physiologic activity is identified within the kidneys and urinary   bladder.    IMPRESSION:     Numerous foci of increased radiotracer uptake throughout the axial and     appendicular skeleton most concerning for metastatic disease similar in   appearance to the prior examination.    Dictation Site: 1        Verified By: HJennette Banker M.D., MD   Relevent Results:   Relevant Scans and Labs whole body bone scan as  well as CT scans of abdomen pelvis head and neck all reviewed   Assessment and Plan: Impression:   widespread metastatic prostate cancer hormone refractoryin89year old male [facility]   at this time I discussed the case personally with Dr. PMa Hillock We feel Xofigo treated would be indicated at this time based on his progressive hormone refractory disease. Risks and benefits of treatment including possibility of nausea vomiting and diarrhea and injury to his bone marrow or were discussed in detail with the patient. He seems to comprehend her treatment plan well.will plan ondelivering 1.35 Ci per kilogram bodyweight based on the total bodyweight of81.2 kg. Giving a dose of the 109.62 Ci of radium to 23. Appointment will be made for his first infusion.  I would like to take this opportunity to thank you for allowing me to continue to participate in this patient's care.  CC Referral:  cc: Dr. JCalla Kicks  Electronic Signatures: CBaruch GoutyGRoda Shutters(MD)  (Signed 29-Jan-15 11:51)  Authored: HPI, Diagnosis, Past Hx, PFSH, Allergies, Home Meds, ROS, Nursing Notes, Physical Exam, Other Results, Relevent Results, Encounter Assessment and Plan, CC Referring Physician   Last Updated: 29-Jan-15 11:51 by CArmstead Peaks(MD)

## 2014-08-29 ENCOUNTER — Ambulatory Visit
Admission: RE | Admit: 2014-08-29 | Discharge: 2014-08-29 | Disposition: A | Discharge status: Home / Self Care | Payer: Medicare Other | Source: Ambulatory Visit | Attending: Radiation Oncology | Admitting: Radiation Oncology

## 2014-08-29 ENCOUNTER — Ambulatory Visit: Payer: Medicare Other | Admitting: Radiation Oncology

## 2014-08-29 DIAGNOSIS — C61 Malignant neoplasm of prostate: Secondary | ICD-10-CM | POA: Insufficient documentation

## 2014-08-29 DIAGNOSIS — Z510 Encounter for antineoplastic radiation therapy: Secondary | ICD-10-CM | POA: Diagnosis not present

## 2014-08-30 ENCOUNTER — Ambulatory Visit
Admission: RE | Admit: 2014-08-30 | Discharge: 2014-08-30 | Disposition: A | Discharge status: Home / Self Care | Payer: Medicare Other | Source: Ambulatory Visit | Attending: Radiation Oncology | Admitting: Radiation Oncology

## 2014-08-30 DIAGNOSIS — Z510 Encounter for antineoplastic radiation therapy: Secondary | ICD-10-CM | POA: Diagnosis not present

## 2014-08-31 ENCOUNTER — Ambulatory Visit
Admission: RE | Admit: 2014-08-31 | Discharge: 2014-08-31 | Disposition: A | Discharge status: Home / Self Care | Payer: Medicare Other | Source: Ambulatory Visit | Attending: Radiation Oncology | Admitting: Radiation Oncology

## 2014-08-31 DIAGNOSIS — Z510 Encounter for antineoplastic radiation therapy: Secondary | ICD-10-CM | POA: Diagnosis not present

## 2014-09-01 ENCOUNTER — Ambulatory Visit
Admission: RE | Admit: 2014-09-01 | Discharge: 2014-09-01 | Disposition: A | Discharge status: Home / Self Care | Payer: Medicare Other | Source: Ambulatory Visit | Attending: Radiation Oncology | Admitting: Radiation Oncology

## 2014-09-01 DIAGNOSIS — Z510 Encounter for antineoplastic radiation therapy: Secondary | ICD-10-CM | POA: Diagnosis not present

## 2014-09-02 ENCOUNTER — Ambulatory Visit
Admission: RE | Admit: 2014-09-02 | Discharge: 2014-09-02 | Disposition: A | Discharge status: Home / Self Care | Payer: Medicare Other | Source: Ambulatory Visit | Attending: Radiation Oncology | Admitting: Radiation Oncology

## 2014-09-02 DIAGNOSIS — Z510 Encounter for antineoplastic radiation therapy: Secondary | ICD-10-CM | POA: Diagnosis not present

## 2014-09-05 ENCOUNTER — Ambulatory Visit
Admission: RE | Admit: 2014-09-05 | Discharge: 2014-09-05 | Disposition: A | Discharge status: Home / Self Care | Payer: Medicare Other | Source: Ambulatory Visit | Attending: Radiation Oncology | Admitting: Radiation Oncology

## 2014-09-05 DIAGNOSIS — Z510 Encounter for antineoplastic radiation therapy: Secondary | ICD-10-CM | POA: Diagnosis not present

## 2014-09-06 ENCOUNTER — Ambulatory Visit
Admission: RE | Admit: 2014-09-06 | Discharge: 2014-09-06 | Disposition: A | Discharge status: Home / Self Care | Payer: Medicare Other | Source: Ambulatory Visit | Attending: Radiation Oncology | Admitting: Radiation Oncology

## 2014-09-06 DIAGNOSIS — Z510 Encounter for antineoplastic radiation therapy: Secondary | ICD-10-CM | POA: Diagnosis not present

## 2014-09-07 ENCOUNTER — Other Ambulatory Visit: Payer: Self-pay

## 2014-09-07 DIAGNOSIS — C7951 Secondary malignant neoplasm of bone: Principal | ICD-10-CM

## 2014-09-07 DIAGNOSIS — C61 Malignant neoplasm of prostate: Secondary | ICD-10-CM

## 2014-09-07 DIAGNOSIS — Z510 Encounter for antineoplastic radiation therapy: Secondary | ICD-10-CM | POA: Diagnosis not present

## 2014-09-08 ENCOUNTER — Telehealth: Payer: Self-pay | Admitting: Internal Medicine

## 2014-09-08 NOTE — Telephone Encounter (Signed)
He will be on vacation week of 09/22/14 and would like to come 09/15/14 instead. Is that okay?

## 2014-09-12 ENCOUNTER — Other Ambulatory Visit: Payer: Self-pay | Admitting: Internal Medicine

## 2014-09-12 ENCOUNTER — Ambulatory Visit: Payer: Medicare Other

## 2014-09-12 ENCOUNTER — Ambulatory Visit
Admission: RE | Admit: 2014-09-12 | Discharge: 2014-09-12 | Disposition: A | Discharge status: Home / Self Care | Payer: Medicare Other | Source: Ambulatory Visit | Attending: Radiation Oncology | Admitting: Radiation Oncology

## 2014-09-12 DIAGNOSIS — C61 Malignant neoplasm of prostate: Secondary | ICD-10-CM

## 2014-09-12 DIAGNOSIS — Z510 Encounter for antineoplastic radiation therapy: Secondary | ICD-10-CM | POA: Diagnosis not present

## 2014-09-12 NOTE — Telephone Encounter (Signed)
Please call in 30-day supply with 6 refills. Thanks.

## 2014-09-12 NOTE — Telephone Encounter (Signed)
Called patient and informed him that he can not get lupron/xgeva any earlier then 09/22/14 because it has to be a certain amount of time between injections.

## 2014-09-15 ENCOUNTER — Ambulatory Visit: Payer: Medicare Other

## 2014-09-15 ENCOUNTER — Other Ambulatory Visit: Payer: Medicare Other

## 2014-09-22 ENCOUNTER — Other Ambulatory Visit: Payer: Medicare Other

## 2014-09-22 ENCOUNTER — Ambulatory Visit: Payer: Medicare Other

## 2014-09-29 ENCOUNTER — Ambulatory Visit: Payer: Medicare Other

## 2014-09-29 ENCOUNTER — Other Ambulatory Visit: Payer: Medicare Other

## 2014-09-29 ENCOUNTER — Ambulatory Visit: Payer: Medicare Other | Admitting: Internal Medicine

## 2014-09-29 ENCOUNTER — Telehealth: Payer: Self-pay

## 2014-09-29 NOTE — Telephone Encounter (Signed)
Mickel Baas made appointment for patient to come in on 09/15/14. I called patient to tell him that this was to early to get lupron/xgeva treatment and he can not get it earlier then 5/26. I asked patient to call me back and I never heard back from patient. Patient was a no show on 5/16 and again on 5/26.

## 2014-10-18 ENCOUNTER — Other Ambulatory Visit: Payer: Self-pay | Admitting: *Deleted

## 2014-10-18 DIAGNOSIS — C61 Malignant neoplasm of prostate: Secondary | ICD-10-CM

## 2014-10-18 MED ORDER — PREDNISONE 5 MG PO TABS: 5.0000 mg | ORAL_TABLET | Freq: Two times a day (BID) | ORAL | Status: DC

## 2014-10-18 NOTE — Telephone Encounter (Signed)
Med ecribed and pt will keep appt for 7/7@ 10am as scheduled. He is in [place] until 6/30

## 2014-10-19 ENCOUNTER — Ambulatory Visit: Payer: Medicare Other | Admitting: Radiation Oncology

## 2014-11-03 ENCOUNTER — Other Ambulatory Visit: Payer: Self-pay | Admitting: *Deleted

## 2014-11-03 ENCOUNTER — Inpatient Hospital Stay: Payer: Medicare Other | Attending: Internal Medicine

## 2014-11-03 ENCOUNTER — Inpatient Hospital Stay (HOSPITAL_BASED_OUTPATIENT_CLINIC_OR_DEPARTMENT_OTHER): Payer: Medicare Other | Admitting: Internal Medicine

## 2014-11-03 ENCOUNTER — Ambulatory Visit
Admission: RE | Admit: 2014-11-03 | Discharge: 2014-11-03 | Disposition: A | Discharge status: Home / Self Care | Payer: Medicare Other | Source: Ambulatory Visit | Attending: Internal Medicine | Admitting: Internal Medicine

## 2014-11-03 ENCOUNTER — Inpatient Hospital Stay: Payer: Medicare Other

## 2014-11-03 VITALS — BP 139/67 | HR 91 | Temp 97.9°F | Resp 20 | Ht 70.5 in | Wt 158.9 lb

## 2014-11-03 DIAGNOSIS — C61 Malignant neoplasm of prostate: Secondary | ICD-10-CM

## 2014-11-03 DIAGNOSIS — Z87891 Personal history of nicotine dependence: Secondary | ICD-10-CM | POA: Insufficient documentation

## 2014-11-03 DIAGNOSIS — C7951 Secondary malignant neoplasm of bone: Secondary | ICD-10-CM

## 2014-11-03 DIAGNOSIS — R911 Solitary pulmonary nodule: Secondary | ICD-10-CM | POA: Diagnosis not present

## 2014-11-03 DIAGNOSIS — D696 Thrombocytopenia, unspecified: Secondary | ICD-10-CM | POA: Insufficient documentation

## 2014-11-03 DIAGNOSIS — R509 Fever, unspecified: Secondary | ICD-10-CM

## 2014-11-03 DIAGNOSIS — R05 Cough: Secondary | ICD-10-CM | POA: Insufficient documentation

## 2014-11-03 DIAGNOSIS — D649 Anemia, unspecified: Secondary | ICD-10-CM | POA: Insufficient documentation

## 2014-11-03 DIAGNOSIS — R059 Cough, unspecified: Secondary | ICD-10-CM

## 2014-11-03 DIAGNOSIS — I10 Essential (primary) hypertension: Secondary | ICD-10-CM | POA: Diagnosis not present

## 2014-11-03 DIAGNOSIS — Z8546 Personal history of malignant neoplasm of prostate: Secondary | ICD-10-CM | POA: Insufficient documentation

## 2014-11-03 DIAGNOSIS — R062 Wheezing: Secondary | ICD-10-CM

## 2014-11-03 DIAGNOSIS — Z79818 Long term (current) use of other agents affecting estrogen receptors and estrogen levels: Secondary | ICD-10-CM | POA: Insufficient documentation

## 2014-11-03 DIAGNOSIS — J439 Emphysema, unspecified: Secondary | ICD-10-CM | POA: Insufficient documentation

## 2014-11-03 DIAGNOSIS — J069 Acute upper respiratory infection, unspecified: Secondary | ICD-10-CM

## 2014-11-03 LAB — CALCIUM: Calcium: 7.9 mg/dL — ABNORMAL LOW (ref 8.9–10.3)

## 2014-11-03 LAB — CREATININE, SERUM
Creatinine, Ser: 0.62 mg/dL (ref 0.61–1.24)
GFR calc non Af Amer: >60 mL/min (ref >60)

## 2014-11-03 LAB — CBC WITH DIFFERENTIAL/PLATELET
Basophils Absolute: 0 10*3/uL (ref 0–0.1)
Basophils Relative: 0 %
EOS ABS: 0 10*3/uL (ref 0–0.7)
EOS PCT: 1 %
HCT: 26.5 % — ABNORMAL LOW (ref 40.0–52.0)
Hemoglobin: 8.7 g/dL — ABNORMAL LOW (ref 13.0–18.0)
LYMPHS ABS: 0.3 10*3/uL — AB (ref 1.0–3.6)
Lymphocytes Relative: 8 %
MCH: 31.7 pg (ref 26.0–34.0)
MCHC: 32.9 g/dL (ref 32.0–36.0)
MCV: 96.3 fL (ref 80.0–100.0)
Monocytes Absolute: 0.5 10*3/uL (ref 0.2–1.0)
Monocytes Relative: 12 %
NEUTROS PCT: 79 %
Neutro Abs: 3.2 10*3/uL (ref 1.4–6.5)
Platelets: 103 10*3/uL — ABNORMAL LOW (ref 150–440)
RBC: 2.76 MIL/uL — AB (ref 4.40–5.90)
RDW: 18.3 % — ABNORMAL HIGH (ref 11.5–14.5)
WBC: 4 10*3/uL (ref 3.8–10.6)

## 2014-11-03 LAB — EXPECTORATED SPUTUM ASSESSMENT W REFEX TO RESP CULTURE

## 2014-11-03 LAB — EXPECTORATED SPUTUM ASSESSMENT W GRAM STAIN, RFLX TO RESP C

## 2014-11-03 LAB — HEPATIC FUNCTION PANEL
ALT: 25 U/L (ref 17–63)
AST: 30 U/L (ref 15–41)
Albumin: 3.6 g/dL (ref 3.5–5.0)
Alkaline Phosphatase: 151 U/L — ABNORMAL HIGH (ref 38–126)
Total Bilirubin: 0.6 mg/dL (ref 0.3–1.2)
Total Protein: 6.9 g/dL (ref 6.5–8.1)

## 2014-11-03 LAB — PSA: PSA: 935 ng/mL — ABNORMAL HIGH (ref 0.00–4.00)

## 2014-11-03 MED ORDER — LEUPROLIDE ACETATE (3 MONTH) 22.5 MG IM KIT
22.5000 mg | PACK | Freq: Once | INTRAMUSCULAR | Status: AC
  Administered 2014-11-03: 22.5 mg via INTRAMUSCULAR
  Filled 2014-11-03: qty 22.5

## 2014-11-03 MED ORDER — AMOXICILLIN-POT CLAVULANATE 875-125 MG PO TABS: 1.0000 | ORAL_TABLET | Freq: Two times a day (BID) | ORAL | Status: AC

## 2014-11-06 LAB — CULTURE, RESPIRATORY W GRAM STAIN

## 2014-11-06 LAB — CULTURE, RESPIRATORY

## 2014-11-07 ENCOUNTER — Other Ambulatory Visit: Payer: Self-pay

## 2014-11-07 DIAGNOSIS — R109 Unspecified abdominal pain: Secondary | ICD-10-CM

## 2014-11-07 DIAGNOSIS — R059 Cough, unspecified: Secondary | ICD-10-CM

## 2014-11-07 DIAGNOSIS — R079 Chest pain, unspecified: Secondary | ICD-10-CM

## 2014-11-07 DIAGNOSIS — C61 Malignant neoplasm of prostate: Secondary | ICD-10-CM

## 2014-11-07 DIAGNOSIS — C7951 Secondary malignant neoplasm of bone: Principal | ICD-10-CM

## 2014-11-07 DIAGNOSIS — R05 Cough: Secondary | ICD-10-CM

## 2014-11-08 NOTE — Patient Instructions (Signed)
Docetaxel injection  What is this medicine?  DOCETAXEL (doe se TAX el) is a chemotherapy drug. It targets fast dividing cells, like cancer cells, and causes these cells to die. This medicine is used to treat many types of cancers like breast cancer, certain stomach cancers, head and neck cancer, lung cancer, and prostate cancer.  This medicine may be used for other purposes; ask your health care provider or pharmacist if you have questions.  COMMON BRAND NAME(S): Docefrez, Taxotere  What should I tell my health care provider before I take this medicine?  They need to know if you have any of these conditions:  -infection (especially a virus infection such as chickenpox, cold sores, or herpes)  -liver disease  -low blood counts, like low white cell, platelet, or red cell counts  -an unusual or allergic reaction to docetaxel, polysorbate 80, other chemotherapy agents, other medicines, foods, dyes, or preservatives  -pregnant or trying to get pregnant  -breast-feeding  How should I use this medicine?  This drug is given as an infusion into a vein. It is administered in a hospital or clinic by a specially trained health care professional.  Talk to your pediatrician regarding the use of this medicine in children. Special care may be needed.  Overdosage: If you think you have taken too much of this medicine contact a poison control center or emergency room at once.  NOTE: This medicine is only for you. Do not share this medicine with others.  What if I miss a dose?  It is important not to miss your dose. Call your doctor or health care professional if you are unable to keep an appointment.  What may interact with this medicine?  -cyclosporine  -erythromycin  -ketoconazole  -medicines to increase blood counts like filgrastim, pegfilgrastim, sargramostim  -vaccines  Talk to your doctor or health care professional before taking any of these medicines:  -acetaminophen  -aspirin  -ibuprofen  -ketoprofen  -naproxen  This list  may not describe all possible interactions. Give your health care provider a list of all the medicines, herbs, non-prescription drugs, or dietary supplements you use. Also tell them if you smoke, drink alcohol, or use illegal drugs. Some items may interact with your medicine.  What should I watch for while using this medicine?  Your condition will be monitored carefully while you are receiving this medicine. You will need important blood work done while you are taking this medicine.  This drug may make you feel generally unwell. This is not uncommon, as chemotherapy can affect healthy cells as well as cancer cells. Report any side effects. Continue your course of treatment even though you feel ill unless your doctor tells you to stop.  In some cases, you may be given additional medicines to help with side effects. Follow all directions for their use.  Call your doctor or health care professional for advice if you get a fever, chills or sore throat, or other symptoms of a cold or flu. Do not treat yourself. This drug decreases your body's ability to fight infections. Try to avoid being around people who are sick.  This medicine may increase your risk to bruise or bleed. Call your doctor or health care professional if you notice any unusual bleeding.  Be careful brushing and flossing your teeth or using a toothpick because you may get an infection or bleed more easily. If you have any dental work done, tell your dentist you are receiving this medicine.  Avoid taking products that   contain aspirin, acetaminophen, ibuprofen, naproxen, or ketoprofen unless instructed by your doctor. These medicines may hide a fever.  This medicine contains an alcohol in the product. You may get drowsy or dizzy. Do not drive, use machinery, or do anything that needs mental alertness until you know how this medicine affects you. Do not stand or sit up quickly, especially if you are an older patient. This reduces the risk of dizzy or  fainting spells. Avoid alcoholic drinks  Do not become pregnant while taking this medicine. Women should inform their doctor if they wish to become pregnant or think they might be pregnant. There is a potential for serious side effects to an unborn child. Talk to your health care professional or pharmacist for more information. Do not breast-feed an infant while taking this medicine.  What side effects may I notice from receiving this medicine?  Side effects that you should report to your doctor or health care professional as soon as possible:  -allergic reactions like skin rash, itching or hives, swelling of the face, lips, or tongue  -low blood counts - This drug may decrease the number of white blood cells, red blood cells and platelets. You may be at increased risk for infections and bleeding.  -signs of infection - fever or chills, cough, sore throat, pain or difficulty passing urine  -signs of decreased platelets or bleeding - bruising, pinpoint red spots on the skin, black, tarry stools, nosebleeds  -signs of decreased red blood cells - unusually weak or tired, fainting spells, lightheadedness  -breathing problems  -fast or irregular heartbeat  -low blood pressure  -mouth sores  -nausea and vomiting  -pain, swelling, redness or irritation at the injection site  -pain, tingling, numbness in the hands or feet  -swelling of the ankle, feet, hands  -weight gain  Side effects that usually do not require medical attention (report to your prescriber or health care professional if they continue or are bothersome):  -bone pain  -complete hair loss including hair on your head, underarms, pubic hair, eyebrows, and eyelashes  -diarrhea  -excessive tearing  -changes in the color of fingernails  -loosening of the fingernails  -nausea  -muscle pain  -red flush to skin  -sweating  -weak or tired  This list may not describe all possible side effects. Call your doctor for medical advice about side effects. You may report side  effects to FDA at 1-800-FDA-1088.  Where should I keep my medicine?  This drug is given in a hospital or clinic and will not be stored at home.  NOTE: This sheet is a summary. It may not cover all possible information. If you have questions about this medicine, talk to your doctor, pharmacist, or health care provider.   2015, [employer]. (2013-03-11 22:21:02)

## 2014-11-09 ENCOUNTER — Ambulatory Visit
Admission: RE | Admit: 2014-11-09 | Discharge: 2014-11-09 | Disposition: A | Discharge status: Home / Self Care | Payer: Medicare Other | Source: Ambulatory Visit | Attending: Internal Medicine | Admitting: Internal Medicine

## 2014-11-09 DIAGNOSIS — C7951 Secondary malignant neoplasm of bone: Secondary | ICD-10-CM | POA: Diagnosis present

## 2014-11-09 DIAGNOSIS — I2510 Atherosclerotic heart disease of native coronary artery without angina pectoris: Secondary | ICD-10-CM | POA: Insufficient documentation

## 2014-11-09 DIAGNOSIS — C61 Malignant neoplasm of prostate: Secondary | ICD-10-CM

## 2014-11-09 DIAGNOSIS — R05 Cough: Secondary | ICD-10-CM

## 2014-11-09 DIAGNOSIS — K8020 Calculus of gallbladder without cholecystitis without obstruction: Secondary | ICD-10-CM | POA: Insufficient documentation

## 2014-11-09 DIAGNOSIS — R079 Chest pain, unspecified: Secondary | ICD-10-CM

## 2014-11-09 DIAGNOSIS — R109 Unspecified abdominal pain: Secondary | ICD-10-CM | POA: Diagnosis present

## 2014-11-09 DIAGNOSIS — R059 Cough, unspecified: Secondary | ICD-10-CM

## 2014-11-09 HISTORY — DX: Malignant neoplasm of prostate: C61

## 2014-11-09 HISTORY — DX: Secondary malignant neoplasm of bone: C79.51

## 2014-11-09 MED ORDER — IOHEXOL 350 MG/ML SOLN
100.0000 mL | Freq: Once | INTRAVENOUS | Status: AC | PRN
  Administered 2014-11-09: 100 mL via INTRAVENOUS

## 2014-11-10 ENCOUNTER — Encounter: Payer: Self-pay | Admitting: Internal Medicine

## 2014-11-10 ENCOUNTER — Inpatient Hospital Stay: Payer: Medicare Other

## 2014-11-10 ENCOUNTER — Ambulatory Visit (INDEPENDENT_AMBULATORY_CARE_PROVIDER_SITE_OTHER): Payer: Medicare Other | Admitting: Internal Medicine

## 2014-11-10 ENCOUNTER — Ambulatory Visit: Payer: Medicare Other | Admitting: Internal Medicine

## 2014-11-10 VITALS — BP 118/76 | HR 78 | Temp 97.8°F | Ht 68.0 in | Wt 157.8 lb

## 2014-11-10 DIAGNOSIS — C7951 Secondary malignant neoplasm of bone: Secondary | ICD-10-CM

## 2014-11-10 DIAGNOSIS — R059 Cough, unspecified: Secondary | ICD-10-CM

## 2014-11-10 DIAGNOSIS — D649 Anemia, unspecified: Secondary | ICD-10-CM

## 2014-11-10 DIAGNOSIS — R918 Other nonspecific abnormal finding of lung field: Secondary | ICD-10-CM

## 2014-11-10 DIAGNOSIS — C61 Malignant neoplasm of prostate: Secondary | ICD-10-CM | POA: Diagnosis not present

## 2014-11-10 DIAGNOSIS — J189 Pneumonia, unspecified organism: Secondary | ICD-10-CM

## 2014-11-10 DIAGNOSIS — R05 Cough: Secondary | ICD-10-CM

## 2014-11-10 LAB — IRON AND TIBC
IRON: 39 ug/dL — AB (ref 45–182)
Saturation Ratios: 14 % — ABNORMAL LOW (ref 17.9–39.5)
TIBC: 281 ug/dL (ref 250–450)
UIBC: 242 ug/dL

## 2014-11-10 LAB — CBC WITH DIFFERENTIAL/PLATELET
Basophils Absolute: 0 10*3/uL (ref 0–0.1)
Basophils Relative: 0 %
Eosinophils Absolute: 0 10*3/uL (ref 0–0.7)
Eosinophils Relative: 0 %
HCT: 31.2 % — ABNORMAL LOW (ref 40.0–52.0)
HEMOGLOBIN: 10.1 g/dL — AB (ref 13.0–18.0)
LYMPHS ABS: 0.3 10*3/uL — AB (ref 1.0–3.6)
LYMPHS PCT: 3 %
MCH: 31.3 pg (ref 26.0–34.0)
MCHC: 32.4 g/dL (ref 32.0–36.0)
MCV: 96.7 fL (ref 80.0–100.0)
Monocytes Absolute: 0.4 10*3/uL (ref 0.2–1.0)
Monocytes Relative: 4 %
NEUTROS PCT: 93 %
Neutro Abs: 9.3 10*3/uL — ABNORMAL HIGH (ref 1.4–6.5)
Platelets: 148 10*3/uL — ABNORMAL LOW (ref 150–440)
RBC: 3.22 MIL/uL — ABNORMAL LOW (ref 4.40–5.90)
RDW: 18.1 % — AB (ref 11.5–14.5)
WBC: 10.1 10*3/uL (ref 3.8–10.6)

## 2014-11-10 LAB — CREATININE, SERUM
CREATININE: 0.7 mg/dL (ref 0.61–1.24)
GFR calc Af Amer: >60 mL/min (ref >60)
GFR calc non Af Amer: >60 mL/min (ref >60)

## 2014-11-10 LAB — FOLATE: Folate: 18.8 ng/mL (ref >5.9)

## 2014-11-10 LAB — SAMPLE TO BLOOD BANK

## 2014-11-10 LAB — VITAMIN B12: Vitamin B-12: 3658 pg/mL — ABNORMAL HIGH (ref 180–914)

## 2014-11-10 LAB — DAT, POLYSPECIFIC AHG (ARMC ONLY): Polyspecific AHG test: NEGATIVE

## 2014-11-10 NOTE — Progress Notes (Signed)
Called pt and explained to him that his CT scan showed a nodule in LUL of lung.  Dr. Ma Hillock states that the are is located at it would be difficult to get a ct guided bx due to the area in the lung it is located.  The best way to obtain a bx for this spot is having bronch done.  We have contacted Dr. Jenell Milliner who would meet with him and then sch. Bronch if he feels pt is good candidate.  Dr. Jenell Milliner not in the office today and our plan is to call md in am and then call pt back if he is agreeable.  Pt. Had several questions.  How big nodule was and what did MD feel it was.  I told him measurements.  I told him it has a good chance of being cancer but not sure if it will would possibly be a new lung cancer or poss. Be prostate cancer that has gone to the lung which makes it so important to find out what it is exactly.  He asked about treatment and I told him first we need to know what we are dealing with but pandit said no matter what he felt the best treatment would be a chemotherapy option.  It would be hard to determine exactly what we will do til we get an answer as to the nodule dx.  Dr. Ma Hillock did want him to go to chemo class today because the likelihood of getting chemo is best answer based on current dx and the poss. Of new dx in regards to lung nodule.  Pt agreeable to me checking the [facility] appt and calling him on cell phone when he have a plan worked out.

## 2014-11-10 NOTE — Progress Notes (Signed)
Date: 11/10/2014  MRN# [id number] Dustin Frazier 12-02-1940  Referring Physician: Dr. Alvino Chapel is a 74 y.o. old male seen in consultation for LUL pulmonary nodule  CC:  Chief Complaint  Patient presents with  . Advice Only    Consult-Dr. Ma Hillock; possible bronch    HPI:  Patient is a pleasant 73 year old male seen in consultation for the left upper lobe pulmonary nodule. Today patient is accompanied by his daughter. 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 He is been following with his oncologist, and having surveillance imaging done. About one month ago he started having cough and fatigue, cough was productive with yellow thick sputum. 2 weeks ago he presented at his oncologist for a follow-up visit, he also complained of cough and fatigue at that time, a chest x-ray was ordered followed by sputum culture. Sputum culture grew out strep pneumoniae, and his chest x-ray showed emphysematous changes with hyperinflation and bone metastases. He was also given a course of Augmentin and prednisone for bronchitis/pneumonia. His cough and sputum production persisted and given his history of metastatic prostate cancer a CAT scan was obtained of the chest which showed multiple left upper lobe pulmonary lesions suspicious for malignancy. Patient was referred to pulmonary for evaluation of these lesions via bronchoscopy with biopsy.   Oncologic History Stage IV metastatic prostate cancer with rising PSA on hormonal therapy (Prostate cancer initially diagnosed in January 2008, Gleason score 7, perineural invasion present). Nov 2011 - PSA up to 208. Repeat bone scan showed progression of bone metastasis with new left rib lesions and worsening vertebral disease. Started oral Zytiga and Prednisone December 2011. 04/14/12 Bone Scan. IMPRESSION: Findings consistent with widespread bony metastatic disease especially in the  spine, pelvis, ribs, femoral shafts and in the right humerus. New abnormal localization is seen within the left mandible raising possibility of necrosis of jaw. Evaluated at [facility] oral surgery and felt not to have osteonecrosis.  Tried Xtandi in 2014. 03/23/13 - serum PSA upto 61.2, also found to have progressive disease.  Pathology Report of the left Mandible Curettage done at [facility] on 04/13/13 - acute osteomyelitis. Zytiga stopped early 2015. Started on Xofigo ([facility]) by Dr.Chrystal Feb 2015, completed 6 doses. Continues on Lupron therapy and is on Xgeva for Bone-related therapy. PMHX:   Past Medical History  Diagnosis Date  . Prostate cancer metastatic to bone 04/2006   Surgical Hx:  Past Surgical History  Procedure Laterality Date  . Back surgery    . Cervical disc surgery    . Kidney stone    . Nasal septum surgery     Family Hx:  Family History  Problem Relation Age of Onset  . Heart attack Father 57    death  . Diabetes Sister   . COPD Sister   . Emphysema Sister   . Heart Problems Sister   . Diabetes Brother   . Heart Problems Brother   . Liver cancer Brother    Social Hx:   History  Substance Use Topics  . Smoking status: Former Smoker -- 1.00 packs/day for 30 years    Types: Cigarettes  . Smokeless tobacco: Never Used  . Alcohol Use: 0.0 oz/week    0 Standard drinks or equivalent per week     Comment: occasional beer   Medication:   Current Outpatient Rx  Name  Route  Sig  Dispense  Refill  . amoxicillin-clavulanate (AUGMENTIN) 875-125  MG per tablet   Oral   Take 1 tablet by mouth 2 (two) times daily.   16 tablet   0   . aspirin 81 MG tablet   Oral   Take 81 mg by mouth daily.         . chlorhexidine (PERIDEX) 0.12 % solution   Mouth Rinse   1 mL by Mouth Rinse route at bedtime.         Marland Kitchen doxazosin (CARDURA) 2 MG tablet   Oral   Take 2 mg by mouth daily.         Marland Kitchen leuprolide (LUPRON) 11.25 MG KIT injection   Intramuscular   Inject 1  each into the muscle every 3 (three) months.         . Multiple Vitamin (MULTI-VITAMINS) TABS   Oral   Take 1 tablet by mouth daily.         . naproxen (NAPROSYN) 500 MG tablet   Oral   Take 500 mg by mouth 2 (two) times daily with a meal.         . OxyCODONE HCl, Abuse Deter, (OXAYDO) 5 MG TABA   Oral   Take 1 tablet by mouth every 4 (four) hours as needed.         . Potassium Chloride Crys CR (KLOR-CON M10 PO)   Oral   Take 1 tablet by mouth daily.         . predniSONE (DELTASONE) 5 MG tablet   Oral   Take 1 tablet (5 mg total) by mouth 2 (two) times daily.   60 tablet   0       Allergies:  Review of patient's allergies indicates no known allergies.  Review of Systems: Gen:  Denies  fever, sweats, chills HEENT: Denies blurred vision, double vision, ear pain, eye pain, hearing loss, nose bleeds, sore throat Cvc:  No dizziness, chest pain or heaviness Resp:   Cough (productive), dyspnea on exertion  Gi: Denies swallowing difficulty, stomach pain, nausea or vomiting, diarrhea, constipation, bowel incontinence Gu:  Denies bladder incontinence, burning urine Ext:   No Joint pain, stiffness or swelling Skin: No skin rash, easy bruising or bleeding or hives Endoc:  No polyuria, polydipsia , polyphagia or weight change Psych: No depression, insomnia or hallucinations  Other:  All other systems negative  Physical Examination:   VS: BP 118/76 mmHg  Pulse 78  Temp(Src) 97.8 F (36.6 C) (Oral)  Ht _0  (1.727 m)  Wt 157 lb 12.8 oz (71.578 kg)  BMI 24.00 kg/m2  SpO2 97%  General Appearance: No distress  Neuro:without focal findings, mental status, speech normal, alert and oriented, cranial nerves 2-12 intact, reflexes normal and symmetric, sensation grossly normal  HEENT: PERRLA, EOM intact, no ptosis, no other lesions noticed; Mallampati 2 Pulmonary: coarse upper airway sounds, dec L base BS, ronchi on the left;   Sputum Production:  None in  office CardiovascularNormal S1,S2.  No m/r/g.  Abdominal aorta pulsation normal.    Abdomen: Benign, Soft, non-tender, No masses, hepatosplenomegaly, No lymphadenopathy Renal:  No costovertebral tenderness  GU:  No performed at this time. Endoc: No evident thyromegaly, no signs of acromegaly or Cushing features Skin:   warm, no rashes, no ecchymosis  Extremities: normal, no cyanosis, clubbing, no edema, warm with normal capillary refill. Other findings:none     Rad results:  CT Chest & A/P 10/2014 CLINICAL DATA: 74 year old male with history of prostate cancer with bone metastasis. Staging examination. Baseline study  prior to initiation of chemotherapy. Recent history of coughing congestion, and recently diagnosed with pneumonia.  EXAM: CT CHEST, ABDOMEN, AND PELVIS WITH CONTRAST  TECHNIQUE: Multidetector CT imaging of the chest, abdomen and pelvis was performed following the standard protocol during bolus administration of intravenous contrast.  CONTRAST: 171m OMNIPAQUE IOHEXOL 350 MG/ML SOLN  COMPARISON: CT the abdomen and pelvis 07/22/2014.  FINDINGS: CT CHEST FINDINGS  Mediastinum/Lymph Nodes: Heart size is normal. Small amount of anterior pericardial fluid and/or thickening, unlikely to be of any hemodynamic significance at this time. No associated pericardial calcification. There is atherosclerosis of the thoracic aorta, the great vessels of the mediastinum and the coronary arteries, including calcified atherosclerotic plaque in the left anterior descending, left circumflex and right coronary arteries. Calcifications of the inferior aspect of the mitral annulus. Prominent nodal tissue in the left hilar region measuring up to 7 mm in thickness. No other definite mediastinal or right hilar lymphadenopathy. Several coarsely calcified mediastinal lymph nodes are noted. Esophagus is unremarkable in appearance. No axillary lymphadenopathy.  Lungs/Pleura: 2.9 x  2.4 cm nodule in the left upper lobe (image 30 of series 2). This is highly spiculated and associated with extensive surrounding architectural distortion and pleural retraction. 1.8 x 0.9 cm nodule in the medial aspect of the left upper lobe abutting the pleura (image 29 of series 2). Background of mild diffuse bronchial wall thickening. Mild centrilobular and paraseptal emphysema. No acute consolidative airspace disease. No pleural effusions.  Musculoskeletal/Soft Tissues: Multifocal sclerotic lesions throughout the visualized axial and appendicular skeleton, compatible with diffuse widespread osseous metastasis.  CT ABDOMEN AND PELVIS FINDINGS  Hepatobiliary: No definite cystic or solid hepatic lesions. No intra or extrahepatic biliary ductal dilatation. Tiny calcified gallstone lying dependently in the gallbladder. No current findings to suggest an acute cholecystitis at this time.  Pancreas: No pancreatic mass. No pancreatic ductal dilatation. No pancreatic or peripancreatic fluid or inflammatory changes.  Spleen: Small calcified granulomas in the spleen.  Adrenals/Urinary Tract: Enlarging right adrenal mass, currently measuring 4.2 x 3.1 cm, again concerning for metastatic disease. Mild thickening of the left adrenal gland is unchanged. Low-attenuation lesions in the kidneys bilaterally, compatible with simple cysts, the largest of which is exophytic in the upper pole of the left kidney measuring up to 3.8 cm in diameter. No hydroureteronephrosis. Urinary bladder appears mildly thickened, which could in part relate to under distention of the urinary bladder on today's examination. No focal bladder wall lesions are identified.  Stomach/Bowel: The appearance of the stomach is normal. 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 Extremely tortuous and redundant sigmoid colon  incidentally noted. Normal appendix. No focal areas of mural thickening or surrounding inflammatory changes associated with the small bowel or colon.  Vascular/Lymphatic: Atherosclerosis throughout the abdominal and pelvic vasculature, without evidence of aneurysm or dissection. No lymphadenopathy noted in the abdomen or pelvis.  Reproductive: Prostate gland and seminal vesicles are grossly unremarkable in appearance.  Other: No significant volume of ascites. No pneumoperitoneum.  Musculoskeletal: Widespread sclerotic lesions again noted throughout the visualized axial and appendicular skeleton, compatible with widespread metastatic disease. The overall burden of disease appears very similar to prior examination 07/22/2014.  IMPRESSION: 1. Widespread metastatic disease to the bones redemonstrated. This appears very similar to the prior examination. Continued enlargement of large right adrenal mass remains highly concerning for adrenal metastasis. 2. Today's study demonstrates 2 large pulmonary nodules in the left upper lobe which have  an aggressive appearance. While it is conceivable that one or both of these areas of apparent nodularity could be resolving areas of post infectious/inflammatory scarring, neoplasm is more strongly favored. The appearance is suspicious in particular for primary bronchogenic neoplasm, particularly with regard to the larger of these 2 nodules (image 30 of series 2). Metastatic disease is not excluded, but is not favored on the basis of the appearance on today's examination. Correlation with biopsy could be considered if clinically appropriate. 3. Circumferential thickening of the urinary bladder on today's examination. This could be treatment related, or secondary to urinary tract infection. Clinical correlation is recommended. 4. Cholelithiasis without evidence of acute cholecystitis at this time. 5. Atherosclerosis, including left main and 3  vessel coronary artery disease. Assessment for potential risk factor modification, dietary therapy or pharmacologic therapy may be warranted, if clinically indicated. 6. Additional incidental findings, as above.   Assessment and Plan: Pneumonia Most likely a postobstructive pneumonia given the location of the left upper lobe lesion and symptoms a patient is having. Continue current course of antibiotics and steroids. Patient is having some clinical improvement. There is an underlying lesion in the area of the pneumonia that is concerning for malignancy given his history of prostate cancer. Plan: -Continue course of antibiotics and steroids -Plan for bronchoscopy with ultrasound and biopsy of left upper lobe lung lesion  Lung mass 2.9 x 2.4 cm nodule in the left upper lobe Suspicious for malignancy: Primary lung versus metastatic prostate  Findings of CAT scan discussed with patient and his daughter. We discussed plan for bronchoscopy with ultrasound and regular fiberoptic bronchoscopy in addition to biopsies. Risk, benefits,, occasions a bronchoscopy with biopsy discussed with the patient and his daughter. Patient verbalizes understanding of procedure, and is in agreement with proceeding forward.  Plan: -Bronchoscopy with ultrasound, biopsy, of left upper lobe pulmonary lesion    Updated Medication List Outpatient Encounter Prescriptions as of 11/10/2014  Medication Sig  . amoxicillin-clavulanate (AUGMENTIN) 875-125 MG per tablet Take 1 tablet by mouth 2 (two) times daily.  Marland Kitchen aspirin 81 MG tablet Take 81 mg by mouth daily.  . chlorhexidine (PERIDEX) 0.12 % solution 1 mL by Mouth Rinse route at bedtime.  Marland Kitchen doxazosin (CARDURA) 2 MG tablet Take 2 mg by mouth daily.  Marland Kitchen leuprolide (LUPRON) 11.25 MG KIT injection Inject 1 each into the muscle every 3 (three) months.  . Multiple Vitamin (MULTI-VITAMINS) TABS Take 1 tablet by mouth daily.  . naproxen (NAPROSYN) 500 MG tablet Take 500 mg  by mouth 2 (two) times daily with a meal.  . OxyCODONE HCl, Abuse Deter, (OXAYDO) 5 MG TABA Take 1 tablet by mouth every 4 (four) hours as needed.  . Potassium Chloride Crys CR (KLOR-CON M10 PO) Take 1 tablet by mouth daily.  . predniSONE (DELTASONE) 5 MG tablet Take 1 tablet (5 mg total) by mouth 2 (two) times daily.  . [DISCONTINUED] lisinopril-hydrochlorothiazide (PRINZIDE,ZESTORETIC) 10-12.5 MG per tablet Take 1 tablet by mouth daily.   No facility-administered encounter medications on file as of 11/10/2014.    Orders for this visit: No orders of the defined types were placed in this encounter.     Thank  you for the consultation and for allowing [facility] to assist in the care of your patient. Our recommendations are noted above.  Please contact us if we can be of further service.   Vilinda Boehringer, MD  Pulmonary and Critical Care Office Number: [phone]

## 2014-11-10 NOTE — Assessment & Plan Note (Signed)
Most likely a postobstructive pneumonia given the location of the left upper lobe lesion and symptoms a patient is having. Continue current course of antibiotics and steroids. Patient is having some clinical improvement. There is an underlying lesion in the area of the pneumonia that is concerning for malignancy given his history of prostate cancer. Plan: -Continue course of antibiotics and steroids -Plan for bronchoscopy with ultrasound and biopsy of left upper lobe lung lesion

## 2014-11-10 NOTE — Patient Instructions (Addendum)
Follow up with Dr. Stevenson Clinch in 6 wks - you have a LUL large pulmonary nodule, concerning for malignancy, which will require biopsy via bronchoscopy (regular and ultrasound) - cont with your current antibiotics for post obstructive pneumonia - cont with exercise has tolerated.

## 2014-11-10 NOTE — Assessment & Plan Note (Signed)
2.9 x 2.4 cm nodule in the left upper lobe Suspicious for malignancy: Primary lung versus metastatic prostate  Findings of CAT scan discussed with patient and his daughter. We discussed plan for bronchoscopy with ultrasound and regular fiberoptic bronchoscopy in addition to biopsies. Risk, benefits,, occasions a bronchoscopy with biopsy discussed with the patient and his daughter. Patient verbalizes understanding of procedure, and is in agreement with proceeding forward.  Plan: -Bronchoscopy with ultrasound, biopsy, of left upper lobe pulmonary lesion

## 2014-11-10 NOTE — Progress Notes (Signed)
[facility]  [phone] Fax:([phone]) [phone]     ID: Dustin Frazier OB: May 20, 1940  MR#: [id number]  YIA#:[id number]  Patient Care Team: [name], MD as PCP - General (Family Medicine)  CHIEF COMPLAINT/DIAGNOSIS:  Stage IV metastatic prostate cancer with rising PSA on hormonal therapy  (Prostate cancer initially diagnosed in January 2008, Gleason score 7, perineural invasion present). Nov 2011 -  PSA up to 208. Repeat bone scan showed progression of bone metastasis with new left rib lesions and worsening vertebral disease. Started oral Zytiga and Prednisone December 2011. 04/14/12 Bone Scan. IMPRESSION:  Findings consistent with widespread bony metastatic disease especially in the spine, pelvis, ribs, femoral shafts and in the right humerus. New abnormal localization is seen within the left mandible raising possibility of necrosis of jaw. Evaluated at [facility] oral surgery and felt not to have osteonecrosis.  Tried Xtandi in 2014. 03/23/13 - serum PSA upto 61.2, also found to have progressive disease.  Pathology Report of the left Mandible Curettage done at [facility] on 04/13/13 - acute osteomyelitis. Zytiga stopped early 2015. Started on Xofigo ([facility]) by Dr.Chrystal Feb 2015, completed 6 doses. Continues on Lupron therapy and is on Xgeva for Bone-related therapy.   HISTORY OF PRESENT ILLNESS:  Patient returns for continued oncology evaluation for prostate cancer as described above, he is on Lupron therapy and Xgeva for Bone-related therapy. He has recently completed course of radium therapy by Dr. Noreene Filbert MD for castrate-resistant relapsed prostate cancer. States that he is having progressive cough, yellowish phlegm, SOB, wheezing and low grade fever. States he visited his neighbor recently when she was sick with pneumonia.  No recurrent back pain or new bone pains. No nausea or vomiting. No urinary symptoms, no hematuria. Eating well. Remains physically active  and ambulatory.    REVIEW OF SYSTEMS:   ROS As in HPI above. In addition, no fever, chills or sweats. No new headaches or focal weakness.  No new mood disturbances. No dizziness or palpitation. No abdominal pain, constipation, diarrhea, dysuria or hematuria. No new skin rash or bleeding symptoms. No new paresthesias in extremities. PS ECOG 1.  PAST MEDICAL HISTORY: Reviewed. Past Medical History  Diagnosis Date  . 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  Bone scan 2008 also showed metastatic disease, patient has been on Lupron injections and getting Zometa infusions at urologist office.  Per records sent by Dr. Jacqlyn Larsen, PSA had shown response to Casodex treatment and had dropped from 13-9, subsequently in December of 2010 PSA went back up to 16.  Repeat PSA on July 13, 2009 Had increased to 35.9.  Pathology Report of the left Mandible Curettage done at [facility] on 04/13/13 - acute osteomyelitis.  PAST SURGICAL HISTORY: Reviewed. As above  FAMILY HISTORY: Reviewed. Noncontributory, denies malignancy.  SOCIAL HISTORY: Reviewed. Chronic smoker 30-pack-year history, does take alcohol on a regular basis mostly beer.  Denies recreational drug usage.  No Known Allergies  Current Outpatient Prescriptions  Medication Sig Dispense Refill  . aspirin 81 MG tablet Take 81 mg by mouth daily.    Marland Kitchen doxazosin (CARDURA) 2 MG tablet Take 2 mg by mouth daily.    Marland Kitchen lisinopril-hydrochlorothiazide (PRINZIDE,ZESTORETIC) 10-12.5 MG per tablet Take 1 tablet by mouth daily.    . naproxen (NAPROSYN) 500 MG tablet Take 500 mg by mouth 2 (two)  times daily with a meal.    . OxyCODONE HCl, Abuse Deter, (OXAYDO) 5 MG TABA Take 1 tablet by mouth every 4 (four) hours as needed.    . Potassium Chloride Crys CR (KLOR-CON M10 PO) Take 1 tablet by mouth daily.    Marland Kitchen amoxicillin-clavulanate (AUGMENTIN)  875-125 MG per tablet Take 1 tablet by mouth 2 (two) times daily. 16 tablet 0  . predniSONE (DELTASONE) 5 MG tablet Take 1 tablet (5 mg total) by mouth 2 (two) times daily. 60 tablet 0   No current facility-administered medications for this visit.    PHYSICAL EXAM: Filed Vitals:   11/03/14 1009  BP: 139/67  Pulse: 91  Temp: 97.9 F (36.6 C)  Resp: 20     Body mass index is 22.48 kg/(m^2).    ECOG FS:1 - Symptomatic but completely ambulatory  GENERAL: Patient is alert and oriented and in no acute distress. There is no icterus. HEENT: EOMs intact. Oral exam negative for thrush or lesions. No cervical lymphadenopathy. CVS: S1S2, regular LUNGS: Bilaterally good air entry, no creps, has few b/l rhonchi. ABDOMEN: Soft, nontender. No hepatomegaly clinically.  NEURO: grossly nonfocal, cranial nerves are intact.   EXTREMITIES: No pedal edema.   LAB RESULTS:    Component Value Date/Time   NA 140 10/06/2013 0949   K 3.7 10/06/2013 0949   CL 103 10/06/2013 0949   CO2 29 10/06/2013 0949   GLUCOSE 116* 10/06/2013 0949   BUN 13 10/06/2013 0949   CREATININE 0.62 11/03/2014 0957   CREATININE 0.83 04/07/2014 0929   CALCIUM 7.9* 11/03/2014 0957   CALCIUM 9.2 04/07/2014 0929   PROT 6.9 11/03/2014 0957   PROT 6.6 04/07/2014 0929   ALBUMIN 3.6 11/03/2014 0957   ALBUMIN 3.5 04/07/2014 0929   AST 30 11/03/2014 0957   AST 17 04/07/2014 0929   ALT 25 11/03/2014 0957   ALT 25 04/07/2014 0929   ALKPHOS 151* 11/03/2014 0957   ALKPHOS 99 04/07/2014 0929   BILITOT 0.6 11/03/2014 0957   GFRNONAA >60 11/03/2014 0957   GFRNONAA >60 01/13/2014 0931   GFRAA >60 11/03/2014 0957   GFRAA >60 01/13/2014 0931   Lab Results  Component Value Date   WBC 4.0 11/03/2014   NEUTROABS 3.2 11/03/2014   HGB 8.7* 11/03/2014   HCT 26.5* 11/03/2014   MCV 96.3 11/03/2014   PLT 103* 11/03/2014     STUDIES: 04/07/14 - serum PSA is 405.2 (was 318.6 on 01/13/14, was 148.9 on 10/21/13, 75.6 on 06/17/13, 59.7 on  05/06/13, 61.2 on 03/23/13, 38 on 01/28/13, 25.2 on 11/12/12).  02/03/13. Bone scan. IMPRESSION:  Numerous foci of increased radiotracer uptake throughout the axial and appendicular skeleton most concerning for metastatic disease similar in appearance to the prior examination.  01/06/14 - Bone Scan. IMPRESSION:  Extensive osseous metastatic disease with overall progression from the prior study.  11/03/14 - CXR. IMPRESSION:  1. Emphysema. No active lung disease.  2. Widespread blastic bone metastases, which have progressed since the chest x-ray of 08/10/2009.    ASSESSMENT / PLAN:   1. Cough, dyspnea, low grade fever - possibly bronchitis or early pneumonia. CXR negative for obvious infiltrate. Will get sputum c/s, start on empiric antibx Augmentin, Prednisone taper for bronchitis/wheezing. Followup in 1 week and plan chemo for Prostate Ca if his acute issues are better.  2. Stage IV hormone-refractory metastatic prostate cancer. Patient had recent rise in PSA upto 61.2 from 38. Bone Scan showed findings consistent with widespread bony metastatic disease especially in  the spine, pelvis, ribs, femoral shafts and in the right humerus with no significant change. CT abdomen/pelvis also showed some left sided asymmetry in prostate but not radically changed. He recently has completed radium treatment ([place]) by Dr. Baruch Gouty. Serum PSA today continues to rise and have discussed further options including chemotherapy versus supportive care/hospice. He wants to pursue chemo, will get restaging CT chest/abdomen/pelvis prior to starting chemo. Will pursue next dose of Lupron 22.5 mg IM here today. Will continue on Xgeva once every 12 weeks if calcium level adequate. Next MD follow up at 1 week and make further plan of management.   3. Anemia, thrombocytopenia - patient is asymptomatic.  Possible etiologies include  possible bone marrow involvement by prostate Ca versus early MDS versus other etiology. Will get further  workup when he comes back next week.    4. In between visits, patient advised to call or come to ER in case of any worsening symptoms or sickness. Patient agreeable to this plan.      Leia Alf, MD   11/10/2014 9:48 AM

## 2014-11-11 ENCOUNTER — Other Ambulatory Visit: Payer: Medicare Other

## 2014-11-11 ENCOUNTER — Ambulatory Visit
Admission: RE | Admit: 2014-11-11 | Discharge: 2014-11-11 | Disposition: A | Discharge status: Home / Self Care | Payer: Medicare Other | Source: Ambulatory Visit | Attending: Radiation Oncology | Admitting: Radiation Oncology

## 2014-11-11 ENCOUNTER — Inpatient Hospital Stay (HOSPITAL_BASED_OUTPATIENT_CLINIC_OR_DEPARTMENT_OTHER): Payer: Medicare Other | Admitting: Internal Medicine

## 2014-11-11 ENCOUNTER — Inpatient Hospital Stay: Payer: Medicare Other

## 2014-11-11 ENCOUNTER — Encounter: Payer: Self-pay | Admitting: Radiation Oncology

## 2014-11-11 VITALS — BP 138/76 | HR 67 | Ht 68.0 in | Wt 158.6 lb

## 2014-11-11 DIAGNOSIS — Z510 Encounter for antineoplastic radiation therapy: Secondary | ICD-10-CM | POA: Insufficient documentation

## 2014-11-11 DIAGNOSIS — C7951 Secondary malignant neoplasm of bone: Secondary | ICD-10-CM | POA: Diagnosis not present

## 2014-11-11 DIAGNOSIS — C3412 Malignant neoplasm of upper lobe, left bronchus or lung: Secondary | ICD-10-CM | POA: Insufficient documentation

## 2014-11-11 DIAGNOSIS — D649 Anemia, unspecified: Secondary | ICD-10-CM | POA: Diagnosis not present

## 2014-11-11 DIAGNOSIS — D696 Thrombocytopenia, unspecified: Secondary | ICD-10-CM | POA: Diagnosis not present

## 2014-11-11 DIAGNOSIS — Z79818 Long term (current) use of other agents affecting estrogen receptors and estrogen levels: Secondary | ICD-10-CM

## 2014-11-11 DIAGNOSIS — C61 Malignant neoplasm of prostate: Secondary | ICD-10-CM | POA: Diagnosis not present

## 2014-11-11 DIAGNOSIS — R911 Solitary pulmonary nodule: Secondary | ICD-10-CM

## 2014-11-11 HISTORY — DX: Pneumonia, unspecified organism: J18.9

## 2014-11-11 LAB — PROTEIN ELECTROPHORESIS, SERUM
A/G Ratio: 1.1 (ref 0.7–1.7)
ALBUMIN ELP: 3.2 g/dL (ref 2.9–4.4)
ALPHA-2-GLOBULIN: 0.9 g/dL (ref 0.4–1.0)
Alpha-1-Globulin: 0.3 g/dL (ref 0.0–0.4)
BETA GLOBULIN: 0.8 g/dL (ref 0.7–1.3)
GAMMA GLOBULIN: 0.9 g/dL (ref 0.4–1.8)
GLOBULIN, TOTAL: 2.9 g/dL (ref 2.2–3.9)
Total Protein ELP: 6.1 g/dL (ref 6.0–8.5)

## 2014-11-11 LAB — ERYTHROPOIETIN: Erythropoietin: 90.7 m[IU]/mL — ABNORMAL HIGH (ref 2.6–18.5)

## 2014-11-11 LAB — CALCIUM: CALCIUM: 8.3 mg/dL — AB (ref 8.9–10.3)

## 2014-11-11 MED ORDER — DENOSUMAB 120 MG/1.7ML ~~LOC~~ SOLN
120.0000 mg | Freq: Once | SUBCUTANEOUS | Status: AC
  Administered 2014-11-11: 120 mg via SUBCUTANEOUS

## 2014-11-11 NOTE — Progress Notes (Signed)
Radiation Oncology Follow up Note  Name: LEMON STERNBERG   Date:   11/11/2014 MRN:  [id number] DOB: 26-Apr-1941    This 74 y.o. male presents to the clinic today for new left lung lesion in patient with known stage IV prostate cancer for evaluation.  REFERRING PROVIDER: [name], MD  HPI: patient is a 74 year old male well known to our department having been treated for stage IV prostate cancer initially diagnosed in January 2008 with a Gleason score of 7. His PSA had climbed up to 208 in 2011 showing progression of disease to bone metastasis. He was started in 2011 On Zytiga and prednisone.he was treated with Heath Lark completed 6 doses in February 2015. Has been on X Geva as well as Lupron. He recently presented with increasing yellowish cough shortness of breath and wheezing and CT scan demonstrated to large pulmonary nodules in the left upper lobe with an aggressive appearance suspicious for primary bronchogenic neoplasm. Also is noted widespread metastatic disease to bone as described above. He is scheduled to see pulmonologist for consideration of EBUS early next week. He is seen today for consideration of treatment options.  COMPLICATIONS OF TREATMENT: none  FOLLOW UP COMPLIANCE: keeps appointments   PHYSICAL EXAM:  BP 138/76 mmHg  Pulse 67  Ht '5\' 8"'$  (1.727 m)  Wt 158 lb 9.9 oz (71.95 kg)  BMI 24.12 kg/m2 No cervical or supraclavicular adenopathy is appreciated lungs are clear to A&P. Well-developed well-nourished patient in NAD. HEENT reveals PERLA, EOMI, discs not visualized.  Oral cavity is clear. No oral mucosal lesions are identified. Neck is clear without evidence of cervical or supraclavicular adenopathy. Lungs are clear to A&P. Cardiac examination is essentially unremarkable with regular rate and rhythm without murmur rub or thrill. Abdomen is benign with no organomegaly or masses noted. Motor sensory and DTR levels are equal and symmetric in the upper and lower extremities.  Cranial nerves II through XII are grossly intact. Proprioception is intact. No peripheral adenopathy or edema is identified. No motor or sensory levels are noted. Crude visual fields are within normal range.   RADIOLOGY RESULTS: CT scans are reviewed  PLAN: I discussed the case personally with medical oncology. Will make final recommendations after tissue confirmation of left lung neoplasm is performed. This is probably a non-small cell primary lung cancer and we can treat with concurrent short course of radiation with chemotherapy.Dr Ma Hillock Described to try to using age intact his affect them on both prostate and lung. 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 Risks and benefits of treatment including increased possible cough fatigue possible radiation esophagitis, skin reaction all were described in detail with the patient. I believe his lesion is too close to the hilum and great vessels of the chest as well as too large to incorporate in SB RT treatment field. I have set up for 2 week follow-up compression have tissue confirmation will go over further treatment record patient's at that time.  I would like to take this opportunity for allowing me to participate in the care of your patient.Armstead Peaks., MD

## 2014-11-14 NOTE — Progress Notes (Signed)
[facility]  [phone] [phone]) [phone]     ID: Wilburn Cornelia OB: November 14, 1940  MR#: [id number]  YQI#:[id number]  Patient Care Team: [name], MD as PCP - General (Family Medicine)  CHIEF COMPLAINT/DIAGNOSIS:  Stage IV metastatic prostate cancer with rising PSA on hormonal therapy  (Prostate cancer initially diagnosed in January 2008, Gleason score 7, perineural invasion present). Nov 2011 -  PSA up to 208. Repeat bone scan showed progression of bone metastasis with new left rib lesions and worsening vertebral disease. Started oral Zytiga and Prednisone December 2011. 04/14/12 Bone Scan. IMPRESSION:  Findings consistent with widespread bony metastatic disease especially in the spine, pelvis, ribs, femoral shafts and in the right humerus. New abnormal localization is seen within the left mandible raising possibility of necrosis of jaw. Evaluated at [facility] oral surgery and felt not to have osteonecrosis.  Tried Xtandi in 2014. 03/23/13 - serum PSA upto 61.2, also found to have progressive disease.  Pathology Report of the left Mandible Curettage done at [facility] on 04/13/13 - acute osteomyelitis. Zytiga stopped early 2015. Started on Xofigo ([facility]) by Dr.Chrystal Feb 2015, completed 6 doses. Continues on Lupron therapy and is on Xgeva for Bone-related therapy.   HISTORY OF PRESENT ILLNESS:  Patient returns for continued oncology follow-up. CT scan shows spiculated lesion near the left hilum suspicious for lung cancer, that is adjacent lesion next to it also. Clinically states that the cough, fever and dyspnea are much better after taking prednisone taper and antibx course.  No recurrent back pain or new bone pains. No nausea or vomiting. No urinary symptoms, no hematuria. Eating well. Remains physically active and ambulatory.    REVIEW OF SYSTEMS:   ROS  As in HPI above. In addition, no fever, chills. No new headaches or focal weakness.  No new mood  disturbances. No dizziness or palpitation. No abdominal pain, constipation, diarrhea, dysuria or hematuria. No new skin rash or bleeding symptoms. No new paresthesias in extremities. PS ECOG 1.  PAST MEDICAL HISTORY: Reviewed. Past Medical History  Diagnosis Date  . 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  Bone scan 2008 also showed metastatic disease, patient has been on Lupron injections and getting Zometa infusions at urologist office.  Per records sent by Dr. Jacqlyn Larsen, PSA had shown response to Casodex treatment and had dropped from 13-9, subsequently in December of 2010 PSA went back up to 16.  Repeat PSA on July 13, 2009 Had increased to 35.9.  Pathology Report of the left Mandible Curettage done at [facility] on 04/13/13 - acute osteomyelitis.  PAST SURGICAL HISTORY: Reviewed. As above  FAMILY HISTORY: Reviewed. Noncontributory, denies malignancy.  SOCIAL HISTORY: Reviewed. Chronic smoker 30-pack-year history, does take alcohol on a regular basis mostly beer.  Denies recreational drug usage.  No Known Allergies  Current Outpatient Prescriptions  Medication Sig Dispense Refill  . aspirin 81 MG tablet Take 81 mg by mouth daily.    . chlorhexidine (PERIDEX) 0.12 % solution 1 mL by Mouth Rinse route at bedtime.    Marland Kitchen doxazosin (CARDURA) 2 MG tablet Take 2 mg by mouth daily.    Marland Kitchen leuprolide (LUPRON) 11.25 MG KIT injection Inject 1 each into the muscle every 3 (three) months.    . Multiple Vitamin (MULTI-VITAMINS) TABS Take 1 tablet by mouth daily.    Marland Kitchen  naproxen (NAPROSYN) 500 MG tablet Take 500 mg by mouth 2 (two) times daily with a meal.    . OxyCODONE HCl, Abuse Deter, (OXAYDO) 5 MG TABA Take 1 tablet by mouth every 4 (four) hours as needed.    . Potassium Chloride Crys CR (KLOR-CON M10 PO) Take 1 tablet by mouth daily.    . predniSONE (DELTASONE) 5  MG tablet Take 1 tablet (5 mg total) by mouth 2 (two) times daily. 60 tablet 0   No current facility-administered medications for this visit.    PHYSICAL EXAM:  ECOG FS:1 - Symptomatic but completely ambulatory GENERAL: Alert and oriented and in no acute distress. There is no icterus. HEENT: EOMs intact. Oral exam negative for thrush or lesions. No cervical lymphadenopathy. CVS: S1S2, regular LUNGS: Bilaterally good air entry, no creps, occasional rhonchi. ABDOMEN: Soft, nontender.  EXTREMITIES: No pedal edema.   LAB RESULTS:    Component Value Date/Time   NA 140 10/06/2013 0949   K 3.7 10/06/2013 0949   CL 103 10/06/2013 0949   CO2 29 10/06/2013 0949   GLUCOSE 116* 10/06/2013 0949   BUN 13 10/06/2013 0949   CREATININE 0.70 11/10/2014 1110   CREATININE 0.83 04/07/2014 0929   CALCIUM 8.3* 11/10/2014 1100   CALCIUM 9.2 04/07/2014 0929   PROT 6.9 11/03/2014 0957   PROT 6.6 04/07/2014 0929   ALBUMIN 3.6 11/03/2014 0957   ALBUMIN 3.5 04/07/2014 0929   AST 30 11/03/2014 0957   AST 17 04/07/2014 0929   ALT 25 11/03/2014 0957   ALT 25 04/07/2014 0929   ALKPHOS 151* 11/03/2014 0957   ALKPHOS 99 04/07/2014 0929   BILITOT 0.6 11/03/2014 0957   GFRNONAA >60 11/10/2014 1110   GFRNONAA >60 01/13/2014 0931   GFRAA >60 11/10/2014 1110   GFRAA >60 01/13/2014 0931   Lab Results  Component Value Date   WBC 10.1 11/10/2014   NEUTROABS 9.3* 11/10/2014   HGB 10.1* 11/10/2014   HCT 31.2* 11/10/2014   MCV 96.7 11/10/2014   PLT 148* 11/10/2014     STUDIES: 04/07/14 - serum PSA is 405.2 (was 318.6 on 01/13/14, was 148.9 on 10/21/13, 75.6 on 06/17/13, 59.7 on 05/06/13, 61.2 on 03/23/13, 38 on 01/28/13, 25.2 on 11/12/12).  02/03/13. Bone scan. IMPRESSION:  Numerous foci of increased radiotracer uptake throughout the axial and appendicular skeleton most concerning for metastatic disease similar in appearance to the prior examination.  01/06/14 - Bone Scan. IMPRESSION:  Extensive osseous  metastatic disease with overall progression from the prior study.  11/03/14 - CXR. IMPRESSION:  1. Emphysema. No active lung disease.  2. Widespread blastic bone metastases, which have progressed since the chest x-ray of 08/10/2009.  11/09/14 - CT abdomen/pelvis. IMPRESSION: 1. Widespread metastatic disease to the bones redemonstrated. This appears very similar to the prior examination. Continued enlargement of large right adrenal mass remains highly concerning for adrenal metastasis. 2. Today's study demonstrates 2 large pulmonary nodules in the left upper lobe which have an aggressive appearance. While it is conceivable that one or both of these areas of apparent nodularity could be resolving areas of post infectious/inflammatory scarring, neoplasm is more strongly favored. The appearance is suspicious in particular for primary bronchogenic neoplasm, particularly with regard to the larger of these 2 nodules (image 30 of series 2). Metastatic disease is not excluded, but is not favored on the basis of the appearance on today's examination. Correlation with biopsy could be considered if clinically appropriate. 3. Circumferential thickening of the urinary bladder on today's examination.  This could be treatment related, or secondary to urinary tract infection. Clinical correlation is recommended. 4. Cholelithiasis without evidence of acute cholecystitis at this time. 5. Atherosclerosis, including left main and 3 vessel coronary artery disease. Assessment for potential risk factor modification, dietary therapy or pharmacologic therapy may be warranted, if clinically indicated. 6. Additional incidental findings, as above.   11/09/14 - CT Chest. IMPRESSION: 1. Widespread metastatic disease to the bones redemonstrated. This appears very similar to the prior examination. Continued enlargement of large right adrenal mass remains highly concerning for adrenal metastasis. 2. Today's study  demonstrates 2 large pulmonary nodules in the left upper lobe which have an aggressive appearance. While it is conceivable that one or both of these areas of apparent nodularity could be resolving areas of post infectious/inflammatory scarring, neoplasm is more strongly favored. The appearance is suspicious in particular for primary bronchogenic neoplasm, particularly with regard to the larger of these 2 nodules (image 30 of series 2). Metastatic disease is not excluded, but is not favored on the basis of the appearance on today's examination. Correlation with biopsy could be considered if clinically appropriate. 3. Circumferential thickening of the urinary bladder on today's examination. This could be treatment related, or secondary to urinary tract infection. Clinical correlation is recommended. 4. Cholelithiasis without evidence of acute cholecystitis at this time. 5. Atherosclerosis, including left main and 3 vessel coronary artery disease. Assessment for potential risk factor modification, dietary therapy or pharmacologic therapy may be warranted, if clinically indicated. 6. Additional incidental findings, as above   ASSESSMENT / PLAN:   1. Cough, dyspnea, low grade fever -  symptoms are much better. CT scan shows spiculated lesion near the left hilum suspicious for second primary lung cancer. We will need to get a bronchoscopy and biopsy, we will refer to pulmonologist as soon as possible.  2. Stage IV hormone-refractory metastatic prostate cancer. Patient had recent rise in PSA upto 61.2 from 38. Bone Scan showed findings consistent with widespread bony metastatic disease especially in the spine, pelvis, ribs, femoral shafts and in the right humerus with no significant change. CT abdomen/pelvis also showed some left sided asymmetry in prostate but not radically changed. He recently has completed radium treatment Trudi Ida) by Dr. Baruch Gouty. Serum PSA continues to rise -  respiratory  symptoms are doing better after treatment as above, however CT scan shows possible lung cancer since he has spiculated lesion in the left hilar area. Once we have diagnosis on lung lesion, then we will make further management plan since he has decided to pursue chemotherapy with Taxotere for castrate resistant prostate cancer. 3. Anemia, thrombocytopenia - patient is asymptomatic. Blood counts today have improved after coming off of alcohol, patient advised to completely avoid taking alcohol. Continue to monitor. 4. In between visits, patient advised to call or come to ER in case of any worsening symptoms or sickness. Patient agreeable to this plan.   Leia Alf, MD   11/14/2014 8:48 PM

## 2014-11-16 ENCOUNTER — Telehealth: Payer: Self-pay | Admitting: *Deleted

## 2014-11-16 NOTE — Telephone Encounter (Signed)
Do not see need to continue antibx. Has lung mass and is seeing Dr.Mungal. Thanks.

## 2014-11-16 NOTE — Telephone Encounter (Signed)
Pt informed no further antibiotics needed

## 2014-11-16 NOTE — Telephone Encounter (Signed)
Took last Amoxicillin this morning for pneumonia, Reports that he is still coughing up tan-brown-orange sputum, but has no fever. Asking if he needs more abx

## 2014-11-17 ENCOUNTER — Other Ambulatory Visit: Payer: Self-pay | Admitting: Internal Medicine

## 2014-11-17 ENCOUNTER — Ambulatory Visit: Payer: Medicare Other | Admitting: Internal Medicine

## 2014-11-17 DIAGNOSIS — R911 Solitary pulmonary nodule: Secondary | ICD-10-CM

## 2014-11-18 ENCOUNTER — Telehealth: Payer: Self-pay | Admitting: *Deleted

## 2014-11-18 NOTE — Telephone Encounter (Signed)
  Oncology Nurse Navigator Documentation    Navigator Encounter Type: Clinic/MDC (11/18/14 0900)               Notified patient of pre-op appointment scheduled for Monday 11/21/14 '@11'$ :30am in [place], 2nd floor, to bring medications. Also notified of procedure scheduled for Tuesday 11/22/14 [place] arrival. Reminded to be NPO after midnight, continue to avoid anticoagulant medications, and have a driver and support person to be with patient at home after the procedure. Patient verbalizes understanding and reads back appointments.

## 2014-11-21 ENCOUNTER — Encounter
Admission: RE | Admit: 2014-11-21 | Discharge: 2014-11-21 | Disposition: A | Discharge status: Home / Self Care | Payer: Medicare Other | Source: Ambulatory Visit | Attending: Internal Medicine | Admitting: Internal Medicine

## 2014-11-21 ENCOUNTER — Other Ambulatory Visit: Payer: Self-pay | Admitting: Internal Medicine

## 2014-11-21 DIAGNOSIS — M1990 Unspecified osteoarthritis, unspecified site: Secondary | ICD-10-CM | POA: Diagnosis not present

## 2014-11-21 DIAGNOSIS — Z791 Long term (current) use of non-steroidal anti-inflammatories (NSAID): Secondary | ICD-10-CM | POA: Diagnosis not present

## 2014-11-21 DIAGNOSIS — C7951 Secondary malignant neoplasm of bone: Secondary | ICD-10-CM | POA: Diagnosis not present

## 2014-11-21 DIAGNOSIS — Z7952 Long term (current) use of systemic steroids: Secondary | ICD-10-CM | POA: Diagnosis not present

## 2014-11-21 DIAGNOSIS — Z79891 Long term (current) use of opiate analgesic: Secondary | ICD-10-CM | POA: Diagnosis not present

## 2014-11-21 DIAGNOSIS — R918 Other nonspecific abnormal finding of lung field: Secondary | ICD-10-CM | POA: Diagnosis present

## 2014-11-21 DIAGNOSIS — Z79899 Other long term (current) drug therapy: Secondary | ICD-10-CM | POA: Diagnosis not present

## 2014-11-21 DIAGNOSIS — C3402 Malignant neoplasm of left main bronchus: Secondary | ICD-10-CM | POA: Diagnosis not present

## 2014-11-21 DIAGNOSIS — C61 Malignant neoplasm of prostate: Secondary | ICD-10-CM | POA: Diagnosis not present

## 2014-11-21 DIAGNOSIS — D649 Anemia, unspecified: Secondary | ICD-10-CM | POA: Diagnosis not present

## 2014-11-21 DIAGNOSIS — Z87891 Personal history of nicotine dependence: Secondary | ICD-10-CM | POA: Diagnosis not present

## 2014-11-21 DIAGNOSIS — J449 Chronic obstructive pulmonary disease, unspecified: Secondary | ICD-10-CM | POA: Diagnosis not present

## 2014-11-21 HISTORY — DX: Unspecified osteoarthritis, unspecified site: M19.90

## 2014-11-21 HISTORY — DX: Anemia, unspecified: D64.9

## 2014-11-21 NOTE — Patient Instructions (Signed)
  Your procedure is scheduled OR:[facility]IFBPP 26, 2016 2Report to Day Surgery. To find out your arrival time please call [phone] between 1PM - 3PM on (Arrival Time 7:00 AM)  Remember: Instructions that are not followed completely may result in serious medical risk, up to and including death, or upon the discretion of your surgeon and anesthesiologist your surgery may need to be rescheduled.    __x__ 1. Do not eat food or drink liquids after midnight. No gum chewing or hard candies.     ____ 2. No Alcohol for 24 hours before or after surgery.   ____ 3. Bring all medications with you on the day of surgery if instructed.    __x__ 4. Notify your doctor if there is any change in your medical condition     (cold, fever, infections).     Do not wear jewelry, make-up, hairpins, clips or nail polish.  Do not wear lotions, powders, or perfumes. You may wear deodorant.  Do not shave 48 hours prior to surgery. Men may shave face and neck.  Do not bring valuables to the hospital.    [facility] is not responsible for any belongings or valuables.               Contacts, dentures or bridgework may not be worn into surgery.  Leave your suitcase in the car. After surgery it may be brought to your room.  For patients admitted to the hospital, discharge time is determined by your                treatment team.   Patients discharged the day of surgery will not be allowed to drive home.   Please read over the following fact sheets that you were given:      __x_ Take these medicines the morning of surgery with A SIP OF WATER:    1. Prednisone  2.   3.   4.  5.  6.  ____ Fleet Enema (as directed)   ____ Use CHG Soap as directed  ____ Use inhalers on the day of surgery  ____ Stop metformin 2 days prior to surgery    ____ Take 1/2 of usual insulin dose the night before surgery and none on the morning of surgery.   __x__ Stop Coumadin/Plavix/aspirin on (PATIENT STOPPED ASPIRIN FIVE DAYS  AGO)  __x__ Stop Anti-inflammatories on (PATIENT STOPPED NAPROXEN FIVE DAYS AGO)   ____ Stop supplements until after surgery.    ____ Bring C-Pap to the hospital.

## 2014-11-22 ENCOUNTER — Ambulatory Visit: Payer: Medicare Other | Admitting: Anesthesiology

## 2014-11-22 ENCOUNTER — Ambulatory Visit: Payer: Medicare Other

## 2014-11-22 ENCOUNTER — Encounter: Payer: Self-pay | Admitting: *Deleted

## 2014-11-22 ENCOUNTER — Ambulatory Visit
Admission: RE | Admit: 2014-11-22 | Discharge: 2014-11-22 | Disposition: A | Discharge status: Home / Self Care | Payer: Medicare Other | Source: Ambulatory Visit | Attending: Internal Medicine | Admitting: Internal Medicine

## 2014-11-22 ENCOUNTER — Encounter
Admission: RE | Disposition: A | Discharge status: Home / Self Care | Payer: Self-pay | Source: Ambulatory Visit | Attending: Internal Medicine

## 2014-11-22 DIAGNOSIS — R918 Other nonspecific abnormal finding of lung field: Secondary | ICD-10-CM | POA: Diagnosis not present

## 2014-11-22 DIAGNOSIS — Z79891 Long term (current) use of opiate analgesic: Secondary | ICD-10-CM | POA: Insufficient documentation

## 2014-11-22 DIAGNOSIS — Z79899 Other long term (current) drug therapy: Secondary | ICD-10-CM | POA: Insufficient documentation

## 2014-11-22 DIAGNOSIS — Z87891 Personal history of nicotine dependence: Secondary | ICD-10-CM | POA: Insufficient documentation

## 2014-11-22 DIAGNOSIS — D649 Anemia, unspecified: Secondary | ICD-10-CM | POA: Insufficient documentation

## 2014-11-22 DIAGNOSIS — Z7952 Long term (current) use of systemic steroids: Secondary | ICD-10-CM | POA: Insufficient documentation

## 2014-11-22 DIAGNOSIS — C61 Malignant neoplasm of prostate: Secondary | ICD-10-CM | POA: Insufficient documentation

## 2014-11-22 DIAGNOSIS — J449 Chronic obstructive pulmonary disease, unspecified: Secondary | ICD-10-CM | POA: Insufficient documentation

## 2014-11-22 DIAGNOSIS — C3402 Malignant neoplasm of left main bronchus: Secondary | ICD-10-CM | POA: Insufficient documentation

## 2014-11-22 DIAGNOSIS — C7951 Secondary malignant neoplasm of bone: Secondary | ICD-10-CM | POA: Insufficient documentation

## 2014-11-22 DIAGNOSIS — M1990 Unspecified osteoarthritis, unspecified site: Secondary | ICD-10-CM | POA: Insufficient documentation

## 2014-11-22 DIAGNOSIS — Z791 Long term (current) use of non-steroidal anti-inflammatories (NSAID): Secondary | ICD-10-CM | POA: Insufficient documentation

## 2014-11-22 HISTORY — PX: ENDOBRONCHIAL ULTRASOUND: SHX5096

## 2014-11-22 LAB — BASIC METABOLIC PANEL
Anion gap: 7 (ref 5–15)
BUN: 16 mg/dL (ref 6–20)
CO2: 26 mmol/L (ref 22–32)
Calcium: 8.5 mg/dL — ABNORMAL LOW (ref 8.9–10.3)
Chloride: 103 mmol/L (ref 101–111)
Creatinine, Ser: 0.68 mg/dL (ref 0.61–1.24)
GFR calc Af Amer: >60 mL/min (ref >60)
Glucose, Bld: 111 mg/dL — ABNORMAL HIGH (ref 65–99)
POTASSIUM: 4.4 mmol/L (ref 3.5–5.1)
Sodium: 136 mmol/L (ref 135–145)

## 2014-11-22 SURGERY — ENDOBRONCHIAL ULTRASOUND (EBUS)
Anesthesia: General

## 2014-11-22 MED ORDER — SUCCINYLCHOLINE CHLORIDE 20 MG/ML IJ SOLN
INTRAMUSCULAR | Status: DC | PRN
  Administered 2014-11-22: 100 mg via INTRAVENOUS

## 2014-11-22 MED ORDER — FENTANYL CITRATE (PF) 100 MCG/2ML IJ SOLN
25.0000 ug | INTRAMUSCULAR | Status: DC | PRN
  Administered 2014-11-22 (×4): 25 ug via INTRAVENOUS

## 2014-11-22 MED ORDER — ROCURONIUM BROMIDE 100 MG/10ML IV SOLN
INTRAVENOUS | Status: DC | PRN
  Administered 2014-11-22: 25 mg via INTRAVENOUS
  Administered 2014-11-22 (×2): 5 mg via INTRAVENOUS

## 2014-11-22 MED ORDER — PROPOFOL 10 MG/ML IV BOLUS
INTRAVENOUS | Status: DC | PRN
  Administered 2014-11-22: 30 mg via INTRAVENOUS
  Administered 2014-11-22: 140 mg via INTRAVENOUS

## 2014-11-22 MED ORDER — FAMOTIDINE 20 MG PO TABS
ORAL_TABLET | ORAL | Status: AC
  Administered 2014-11-22: 20 mg via ORAL
  Filled 2014-11-22: qty 1

## 2014-11-22 MED ORDER — FENTANYL CITRATE (PF) 100 MCG/2ML IJ SOLN
INTRAMUSCULAR | Status: DC | PRN
  Administered 2014-11-22: 25 ug via INTRAVENOUS
  Administered 2014-11-22: 50 ug via INTRAVENOUS

## 2014-11-22 MED ORDER — LACTATED RINGERS IV SOLN
INTRAVENOUS | Status: DC
  Administered 2014-11-22 (×2): via INTRAVENOUS

## 2014-11-22 MED ORDER — ONDANSETRON HCL 4 MG/2ML IJ SOLN: 4.0000 mg | Freq: Once | INTRAMUSCULAR | Status: DC | PRN

## 2014-11-22 MED ORDER — MIDAZOLAM HCL 2 MG/2ML IJ SOLN
INTRAMUSCULAR | Status: DC | PRN
  Administered 2014-11-22: 1 mg via INTRAVENOUS

## 2014-11-22 MED ORDER — PROPOFOL INFUSION 10 MG/ML OPTIME
INTRAVENOUS | Status: DC | PRN
  Administered 2014-11-22: 100 ug/kg/min via INTRAVENOUS

## 2014-11-22 MED ORDER — PROPOFOL 10 MG/ML IV BOLUS: INTRAVENOUS | Status: DC | PRN

## 2014-11-22 MED ORDER — FAMOTIDINE 20 MG PO TABS
20.0000 mg | ORAL_TABLET | Freq: Once | ORAL | Status: AC
  Administered 2014-11-22: 20 mg via ORAL

## 2014-11-22 MED ORDER — FENTANYL CITRATE (PF) 100 MCG/2ML IJ SOLN
INTRAMUSCULAR | Status: AC
  Filled 2014-11-22: qty 2

## 2014-11-22 NOTE — Anesthesia Postprocedure Evaluation (Signed)
  Anesthesia Post-op Note  Patient: Dustin Frazier  Procedure(s) Performed: Procedure(s): ENDOBRONCHIAL ULTRASOUND (N/A)  Anesthesia type:General  Patient location: PACU  Post pain: Pain level controlled  Post assessment: Post-op Vital signs reviewed, Patient's Cardiovascular Status Stable, Respiratory Function Stable, Patent Airway and No signs of Nausea or vomiting  Post vital signs: Reviewed and stable  Last Vitals:  Filed Vitals:   11/22/14 0713  BP: 153/74  Pulse: 113  Temp: 37.2 C  Resp: 16    Level of consciousness: awake, alert  and patient cooperative  Complications: No apparent anesthesia complications

## 2014-11-22 NOTE — Anesthesia Preprocedure Evaluation (Addendum)
Anesthesia Evaluation  Patient identified by MRN, date of birth, ID band Patient awake    Reviewed: Allergy & Precautions, NPO status , Patient's Chart, lab work & pertinent test results  Airway Mallampati: II       Dental  (+) Edentulous Upper, Edentulous Lower   Pulmonary COPDformer smoker,  + rhonchi   + decreased breath sounds      Cardiovascular negative cardio ROS Normal cardiovascular exam    Neuro/Psych    GI/Hepatic negative GI ROS, Neg liver ROS,   Endo/Other  negative endocrine ROS  Renal/GU negative Renal ROS  negative genitourinary   Musculoskeletal  (+) Arthritis -,   Abdominal Normal abdominal exam  (+)   Peds negative pediatric ROS (+)  Hematology  (+) anemia ,   Anesthesia Other Findings   Reproductive/Obstetrics                            Anesthesia Physical Anesthesia Plan  ASA: III  Anesthesia Plan: General   Post-op Pain Management:    Induction: Intravenous  Airway Management Planned: Oral ETT  Additional Equipment:   Intra-op Plan:   Post-operative Plan: Extubation in OR  Informed Consent: I have reviewed the patients History and Physical, chart, labs and discussed the procedure including the risks, benefits and alternatives for the proposed anesthesia with the patient or authorized representative who has indicated his/her understanding and acceptance.     Plan Discussed with: CRNA  Anesthesia Plan Comments:         Anesthesia Quick Evaluation

## 2014-11-22 NOTE — H&P (Signed)
H&P as completed on 11/11/14, see clinic note, reviewed, no changes.   Vilinda Boehringer, MD [facility] Pager [phone] (Please enter [phone]-digits)

## 2014-11-22 NOTE — Discharge Instructions (Addendum)

## 2014-11-22 NOTE — Anesthesia Procedure Notes (Signed)
Procedure Name: Intubation Date/Time: 11/22/2014 8:15 AM Performed by: Allean Found Pre-anesthesia Checklist: Patient identified, Timeout performed, Emergency Drugs available, Suction available and Patient being monitored Patient Re-evaluated:Patient Re-evaluated prior to inductionOxygen Delivery Method: Circle system utilized Preoxygenation: Pre-oxygenation with 100% oxygen Intubation Type: IV induction Ventilation: Mask ventilation without difficulty Laryngoscope Size: Mac and 3 Grade View: Grade I Tube type: Oral Tube size: 8.5 mm Number of attempts: 1 Airway Equipment and Method: Stylet Placement Confirmation: ETT inserted through vocal cords under direct vision,  positive ETCO2 and breath sounds checked- equal and bilateral Secured at: 23 cm Tube secured with: Tape

## 2014-11-22 NOTE — Op Note (Signed)
[facility] Patient Name: Dustin Frazier Procedure Date: 11/22/2014 7:37 AM MRN: [id number] Account #: [id number] Admit Type: Outpatient Room: OR Note Status: Finalized Attending MD: Vilinda Boehringer,  Procedure:         Bronchoscopy Indications:       Left upper lobe mass Providers:         Lorelai Huyser, Sullivan Lone, Technician (Technician),                     Andrey Cota (Technician) Referring MD:       Medicines:         See the Anesthesia note for documentation of the                     administered medications Complications:     No immediate complications. Estimated blood loss: Minimal Procedure:         Pre-Anesthesia Assessment:                    - A History and Physical has been performed. Patient meds                     and allergies have been reviewed. The risks and benefits                     of the procedure and the sedation options and risks were                     discussed with the patient. All questions were answered                     and informed consent was obtained. Patient identification                     and proposed procedure were verified prior to the                     procedure by the physician in the procedure room. Mental                     Status Examination: alert and oriented. Respiratory                     Examination: rhonchi. CV Examination: normal. After                     reviewing the risks and benefits, the patient was deemed                     in satisfactory condition to undergo the procedure. The                     anesthesia plan was to use general anesthesia. Immediately                     prior to administration of medications, the patient was                     re-assessed for adequacy to receive sedatives. The heart                     rate, respiratory rate, oxygen saturations, blood                     pressure,  adequacy of pulmonary ventilation, and response                     to care were monitored  throughout the procedure. The                     physical status of the patient was re-assessed after the                     procedure.                    After obtaining informed consent, the bronchoscope was                     passed under direct vision. Throughout the procedure, the                     patient's blood pressure, pulse, and oxygen saturations                     were monitored continuously. the Bronchoscope Olympus                     BF-Q180 S# [id number] was introduced through the mouth, via                     the endotracheal tube (the patient was intubated for the                     procedure) and advanced to the tracheobronchial tree. The                     procedure was accomplished with ease. The patient                     tolerated the procedure well. The total duration of the                     procedure was 45 minutes. Total fluoroscopy time was 7                     minutes. Findings:      The nasopharynx/oropharynx appears normal. The larynx appears normal.       The vocal cords are normal and move normally with phonation and       breathing. The subglottic space is normal. The trachea is of normal       caliber. The carina is sharp. The tracheobronchial tree of the right       lung was examined to at least the first subsegmental level. Bronchial       mucosa and anatomy in the right lung are normal; there are no       endobronchial lesions, and no secretions.      Left Lung Abnormalities: Extrinsic compression was found in the anterior       segment of the left upper lobe (B3). The airway lumen is greater than       90% occluded. The lesion was not traversed. A small partially       obstructing (about 70% obstructed) ulcerated lesion was found in the       anterior segment of the left upper lobe (B3).      Transbronchial biopsies were performed in the LUL anterior segment (B3)       of the lung using  forceps and sent for cytology. The procedure was        guided by fluoroscopy. Four biopsy passes were performed. Four biopsy       samples were obtained.      Transbronchial biopsies were performed in the LUL anterior segment (B3)       of the lung using a 19 gauge Wang needle and sent for analysis and       cytology. The procedure was guided by fluoroscopy. Five biopsy passes       were performed. Five biopsy samples were obtained.      Fluoroscopically guided transbronchial brushings were obtained in the       left upper lobe of the lung and sent for cytology. Four samples were       obtained.      Bronchoalveolar lavage was performed in the LUL anterior segment (B3) of       the lung and sent for cell count, bacterial culture, viral smears &       culture, and fungal & AFB analysis and cytology. 80 mL of fluid were       instilled. 25 mL were returned. The return was bloody and mucoid. There       were no mucoid plugs in the return fluid Impression:        - The right lung was normal.                    - Extrinsic compression was found in the anterior segment                     of the left upper lobe (B3) secondary to a mass.                    - A lesion was found in the anterior segment of the left                     upper lobe (B3).                    - Transbronchial lung biopsies were performed.                    - Transbronchial lung biopsies were performed.                    - Fluoroscopically guided transbronchial brushings were                     obtained.                    - Bronchoalveolar lavage was performed. Recommendation:    - Await BAL, biopsy, brushing, culture, cytology and lab                     results. Jenaro Souder,  11/22/2014 9:48:28 AM Number of Addenda: 0 Note Initiated On: 11/22/2014 7:37 AM      [facility]

## 2014-11-22 NOTE — Transfer of Care (Signed)
Immediate Anesthesia Transfer of Care Note  Patient: Dustin Frazier  Procedure(s) Performed: Procedure(s): ENDOBRONCHIAL ULTRASOUND (N/A)  Patient Location: PACU  Anesthesia Type:General  Level of Consciousness: sedated  Airway & Oxygen Therapy: Patient Spontanous Breathing and Patient connected to face mask oxygen  Post-op Assessment: Report given to RN and Post -op Vital signs reviewed and stable  Post vital signs: Reviewed and stable  Last Vitals:  Filed Vitals:   11/22/14 0935  BP: 152/82  Pulse: 102  Temp: 37 C  Resp: 15    Complications: No apparent anesthesia complications

## 2014-11-23 ENCOUNTER — Ambulatory Visit: Payer: Medicare Other | Admitting: Radiation Oncology

## 2014-11-29 LAB — CULTURE, BAL-QUANTITATIVE W GRAM STAIN: Special Requests: NORMAL

## 2014-11-29 LAB — CULTURE, BAL-QUANTITATIVE

## 2014-12-01 ENCOUNTER — Telehealth: Payer: Self-pay | Admitting: *Deleted

## 2014-12-01 ENCOUNTER — Ambulatory Visit: Payer: Medicare Other | Admitting: Radiation Oncology

## 2014-12-01 MED ORDER — AMOXICILLIN-POT CLAVULANATE 875-125 MG PO TABS: 1.0000 | ORAL_TABLET | Freq: Two times a day (BID) | ORAL | Status: DC

## 2014-12-01 NOTE — Telephone Encounter (Signed)
Pt was reviewing my chart results of bacteria growing on his lavage cult. From bronch.  He is aware of the cytology still pending.  He states that he is still coughing up colored sputum as well as sob on exertion.  Wondered if she should be on atb until all results have came back. He is still on prednisone but back to his regular dose of 5 mg bid.  I called pandit and his sensitivity on lavage sputum is sensitive to Augmentin so order 875/125 mg 1 tablet bid x 8 days, called it into pharmacy and called pt to let him know plan.

## 2014-12-06 ENCOUNTER — Other Ambulatory Visit: Payer: Self-pay | Admitting: *Deleted

## 2014-12-06 ENCOUNTER — Telehealth: Payer: Self-pay | Admitting: *Deleted

## 2014-12-06 DIAGNOSIS — C7951 Secondary malignant neoplasm of bone: Principal | ICD-10-CM

## 2014-12-06 DIAGNOSIS — C61 Malignant neoplasm of prostate: Secondary | ICD-10-CM

## 2014-12-06 MED ORDER — DEXAMETHASONE 4 MG PO TABS: ORAL_TABLET | ORAL | Status: DC

## 2014-12-06 NOTE — Telephone Encounter (Signed)
Called in dexamethasone for patient as pandit verbally gave me to for his chemo regimen

## 2014-12-12 ENCOUNTER — Inpatient Hospital Stay: Payer: Medicare Other

## 2014-12-12 ENCOUNTER — Telehealth: Payer: Self-pay | Admitting: *Deleted

## 2014-12-12 ENCOUNTER — Ambulatory Visit: Payer: Medicare Other | Admitting: Radiation Oncology

## 2014-12-12 ENCOUNTER — Other Ambulatory Visit: Payer: Self-pay | Admitting: *Deleted

## 2014-12-12 ENCOUNTER — Encounter: Payer: Self-pay | Admitting: Radiation Oncology

## 2014-12-12 ENCOUNTER — Ambulatory Visit
Admission: RE | Admit: 2014-12-12 | Discharge: 2014-12-12 | Disposition: A | Discharge status: Home / Self Care | Payer: Medicare Other | Source: Ambulatory Visit | Attending: Radiation Oncology | Admitting: Radiation Oncology

## 2014-12-12 ENCOUNTER — Inpatient Hospital Stay: Payer: Medicare Other | Attending: Internal Medicine | Admitting: Internal Medicine

## 2014-12-12 VITALS — BP 137/79 | HR 88 | Temp 97.5°F | Resp 18 | Wt 152.0 lb

## 2014-12-12 DIAGNOSIS — I10 Essential (primary) hypertension: Secondary | ICD-10-CM | POA: Diagnosis not present

## 2014-12-12 DIAGNOSIS — R05 Cough: Secondary | ICD-10-CM | POA: Diagnosis not present

## 2014-12-12 DIAGNOSIS — D649 Anemia, unspecified: Secondary | ICD-10-CM

## 2014-12-12 DIAGNOSIS — R42 Dizziness and giddiness: Secondary | ICD-10-CM | POA: Diagnosis not present

## 2014-12-12 DIAGNOSIS — Z5111 Encounter for antineoplastic chemotherapy: Secondary | ICD-10-CM | POA: Diagnosis present

## 2014-12-12 DIAGNOSIS — F17210 Nicotine dependence, cigarettes, uncomplicated: Secondary | ICD-10-CM | POA: Insufficient documentation

## 2014-12-12 DIAGNOSIS — Z79899 Other long term (current) drug therapy: Secondary | ICD-10-CM | POA: Diagnosis not present

## 2014-12-12 DIAGNOSIS — C61 Malignant neoplasm of prostate: Secondary | ICD-10-CM | POA: Diagnosis not present

## 2014-12-12 DIAGNOSIS — C3402 Malignant neoplasm of left main bronchus: Secondary | ICD-10-CM

## 2014-12-12 DIAGNOSIS — C7951 Secondary malignant neoplasm of bone: Secondary | ICD-10-CM | POA: Diagnosis not present

## 2014-12-12 LAB — CBC WITH DIFFERENTIAL/PLATELET
BASOS PCT: 0 %
Basophils Absolute: 0 10*3/uL (ref 0–0.1)
EOS ABS: 0 10*3/uL (ref 0–0.7)
Eosinophils Relative: 0 %
HCT: 25.2 % — ABNORMAL LOW (ref 40.0–52.0)
HEMOGLOBIN: 8.3 g/dL — AB (ref 13.0–18.0)
Lymphocytes Relative: 7 %
Lymphs Abs: 0.4 10*3/uL — ABNORMAL LOW (ref 1.0–3.6)
MCH: 31.4 pg (ref 26.0–34.0)
MCHC: 33 g/dL (ref 32.0–36.0)
MCV: 95 fL (ref 80.0–100.0)
MONO ABS: 0.3 10*3/uL (ref 0.2–1.0)
MONOS PCT: 6 %
NEUTROS PCT: 87 %
Neutro Abs: 4.9 10*3/uL (ref 1.4–6.5)
Platelets: 128 10*3/uL — ABNORMAL LOW (ref 150–440)
RBC: 2.65 MIL/uL — ABNORMAL LOW (ref 4.40–5.90)
RDW: 17.8 % — AB (ref 11.5–14.5)
WBC: 5.6 10*3/uL (ref 3.8–10.6)

## 2014-12-12 LAB — COMPREHENSIVE METABOLIC PANEL
ALT: 51 U/L (ref 17–63)
AST: 37 U/L (ref 15–41)
Albumin: 3.1 g/dL — ABNORMAL LOW (ref 3.5–5.0)
Alkaline Phosphatase: 285 U/L — ABNORMAL HIGH (ref 38–126)
Anion gap: 8 (ref 5–15)
BUN: 18 mg/dL (ref 6–20)
CO2: 25 mmol/L (ref 22–32)
Calcium: 7.7 mg/dL — ABNORMAL LOW (ref 8.9–10.3)
Chloride: 105 mmol/L (ref 101–111)
Creatinine, Ser: 0.77 mg/dL (ref 0.61–1.24)
GFR calc Af Amer: >60 mL/min (ref >60)
GFR calc non Af Amer: >60 mL/min (ref >60)
Glucose, Bld: 127 mg/dL — ABNORMAL HIGH (ref 65–99)
Potassium: 4.1 mmol/L (ref 3.5–5.1)
Sodium: 138 mmol/L (ref 135–145)
Total Bilirubin: 0.6 mg/dL (ref 0.3–1.2)
Total Protein: 6.3 g/dL — ABNORMAL LOW (ref 6.5–8.1)

## 2014-12-12 LAB — FUNGUS CULTURE W SMEAR: Special Requests: NORMAL

## 2014-12-12 MED ORDER — MECLIZINE HCL 25 MG PO TABS: 25.0000 mg | ORAL_TABLET | Freq: Three times a day (TID) | ORAL | Status: DC | PRN

## 2014-12-12 NOTE — Progress Notes (Signed)
Pt here and experiencing dizziness when he gets up.and spoke to chrystal and he suggested to speak to pandit about getting medication for dizziness and pt agreeable to seeing ENT or MRI if needed.  He did fall about 2 weeks ago and hit his head and it is healed now. He does not take b/p medication.

## 2014-12-12 NOTE — Progress Notes (Signed)
Pt has been having dizziness for 2-3 weeks mainly when he stands up but also at times when lying.

## 2014-12-12 NOTE — Progress Notes (Signed)
Radiation Oncology Follow up Note  Name: Dustin Frazier   Date:   12/12/2014 MRN:  [id number] DOB: 1940-11-24    This 74 y.o. male presents to the clinic today for lung cancer newly diagnosed.  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 Recently presented with yellowish cough shortness of breath CT scan showed a large pulmonary nodule in the left upper lobe suspicious for primary bronchogenic neoplasm. We sent him for biopsy which was performed showing poorly differentiated carcinoma consistent with non-small cell lung cancer. He is seen today for those results and for discussion of treatment planning. He candidate continues to do fairly well pain of his bone metastasis is under good control his cough has improved slightly.. Patient has been complaining of some dizziness over the past 2 weeks I've talked to Dr Ma Hillock and he will be putting him on Antivert as well as probably obtaining an MRI scan of his brain  COMPLICATIONS OF TREATMENT: none  FOLLOW UP COMPLIANCE: keeps appointments   PHYSICAL EXAM:  BP 137/79 mmHg  Pulse 88  Temp(Src) 97.5 F (36.4 C)  Resp 18  Wt 152 lb 0.1 oz (68.95 kg) Well-developed well-nourished patient in NAD. HEENT reveals PERLA, EOMI, discs not visualized.  Oral cavity is clear. No oral mucosal lesions are identified. Neck is clear without evidence of cervical or supraclavicular adenopathy. Lungs are clear to A&P. Cardiac examination is essentially unremarkable with regular rate and rhythm without murmur rub or thrill. Abdomen is benign with no organomegaly or masses noted. Motor sensory and DTR levels are equal and symmetric in the upper and lower extremities. Cranial nerves II through XII are grossly intact. Proprioception is intact. No peripheral adenopathy or edema is identified. No motor or  sensory levels are noted. Crude visual fields are within normal range.   RADIOLOGY RESULTS: CT scan of chest is reviewed  PLAN: At this time like to ahead with a short course of IM RT radiation therapy to his chest. Since elbow near his hilum and great vessels would plan on delivering 6000 cGy in 10 fractions using I am RT treatment planning and delivery. I which she is I am RT to reduce the dose to was normal lung as well as his heart which are in close proximity especially using the hypofractionated course of radiation which I plan to start his stage IV prostate cancer. Risks and benefits of treatment including possible radiation esophagitis, cough, fatigue, alteration of blood counts, and skin reaction all were discussed in detail with the patient and his brother. I have set up and ordered CT simulation for later this week. I discussed the case again personally with medical oncology.  I would like to take this opportunity for allowing me to participate in the care of your patient.Armstead Peaks., MD

## 2014-12-12 NOTE — Telephone Encounter (Signed)
rcvd call from Dr. Reuel Derby that the preliminary  Poorly differentiated neoplasm, does not look like prostate. No more tissue for add. Testing. Info given to Dr. Ma Hillock  And pt coming today to clinic and we will discuss results.

## 2014-12-13 ENCOUNTER — Telehealth: Payer: Self-pay

## 2014-12-13 ENCOUNTER — Inpatient Hospital Stay: Payer: Medicare Other

## 2014-12-13 VITALS — BP 118/70 | HR 63 | Temp 96.4°F | Resp 20

## 2014-12-13 DIAGNOSIS — C7951 Secondary malignant neoplasm of bone: Principal | ICD-10-CM

## 2014-12-13 DIAGNOSIS — Z5111 Encounter for antineoplastic chemotherapy: Secondary | ICD-10-CM | POA: Diagnosis not present

## 2014-12-13 DIAGNOSIS — C61 Malignant neoplasm of prostate: Secondary | ICD-10-CM

## 2014-12-13 LAB — CYTOLOGY - NON PAP

## 2014-12-13 LAB — SURGICAL PATHOLOGY

## 2014-12-13 MED ORDER — SODIUM CHLORIDE 0.9 % IV SOLN
Freq: Once | INTRAVENOUS | Status: AC
  Administered 2014-12-13: 10:00:00 via INTRAVENOUS
  Filled 2014-12-13: qty 8

## 2014-12-13 MED ORDER — DIPHENHYDRAMINE HCL 50 MG/ML IJ SOLN
25.0000 mg | Freq: Once | INTRAMUSCULAR | Status: AC
  Administered 2014-12-13: 25 mg via INTRAVENOUS
  Filled 2014-12-13: qty 1

## 2014-12-13 MED ORDER — DENOSUMAB 120 MG/1.7ML ~~LOC~~ SOLN
120.0000 mg | Freq: Once | SUBCUTANEOUS | Status: AC
  Administered 2014-12-13: 120 mg via SUBCUTANEOUS
  Filled 2014-12-13: qty 1.7

## 2014-12-13 MED ORDER — DOCETAXEL CHEMO INJECTION 160 MG/16ML
75.0000 mg/m2 | Freq: Once | INTRAVENOUS | Status: AC
  Administered 2014-12-13: 140 mg via INTRAVENOUS
  Filled 2014-12-13: qty 14

## 2014-12-13 MED ORDER — FAMOTIDINE IN NACL 20-0.9 MG/50ML-% IV SOLN
20.0000 mg | Freq: Two times a day (BID) | INTRAVENOUS | Status: DC
  Administered 2014-12-13: 20 mg via INTRAVENOUS
  Filled 2014-12-13: qty 50

## 2014-12-13 MED ORDER — SODIUM CHLORIDE 0.9 % IV SOLN
Freq: Once | INTRAVENOUS | Status: AC
  Administered 2014-12-13: 09:00:00 via INTRAVENOUS
  Filled 2014-12-13: qty 1000

## 2014-12-13 NOTE — Telephone Encounter (Signed)
Calcium =7.7, albumin =3.1 corrected calcium =8.42

## 2014-12-15 ENCOUNTER — Ambulatory Visit
Admission: RE | Admit: 2014-12-15 | Discharge: 2014-12-15 | Disposition: A | Discharge status: Home / Self Care | Payer: Medicare Other | Source: Ambulatory Visit | Attending: Internal Medicine | Admitting: Internal Medicine

## 2014-12-15 ENCOUNTER — Ambulatory Visit
Admission: RE | Admit: 2014-12-15 | Discharge: 2014-12-15 | Disposition: A | Discharge status: Home / Self Care | Payer: Medicare Other | Source: Ambulatory Visit | Attending: Radiation Oncology | Admitting: Radiation Oncology

## 2014-12-15 DIAGNOSIS — C7951 Secondary malignant neoplasm of bone: Secondary | ICD-10-CM | POA: Diagnosis present

## 2014-12-15 DIAGNOSIS — C3412 Malignant neoplasm of upper lobe, left bronchus or lung: Secondary | ICD-10-CM | POA: Diagnosis not present

## 2014-12-15 DIAGNOSIS — M4802 Spinal stenosis, cervical region: Secondary | ICD-10-CM | POA: Diagnosis not present

## 2014-12-15 DIAGNOSIS — C61 Malignant neoplasm of prostate: Secondary | ICD-10-CM | POA: Diagnosis not present

## 2014-12-15 DIAGNOSIS — R42 Dizziness and giddiness: Secondary | ICD-10-CM | POA: Insufficient documentation

## 2014-12-15 DIAGNOSIS — Z510 Encounter for antineoplastic radiation therapy: Secondary | ICD-10-CM | POA: Diagnosis present

## 2014-12-15 MED ORDER — GADOBENATE DIMEGLUMINE 529 MG/ML IV SOLN
15.0000 mL | Freq: Once | INTRAVENOUS | Status: AC | PRN
  Administered 2014-12-15: 14 mL via INTRAVENOUS

## 2014-12-19 ENCOUNTER — Telehealth: Payer: Self-pay | Admitting: *Deleted

## 2014-12-19 DIAGNOSIS — C61 Malignant neoplasm of prostate: Secondary | ICD-10-CM

## 2014-12-19 MED ORDER — PREDNISONE 5 MG PO TABS: 5.0000 mg | ORAL_TABLET | Freq: Two times a day (BID) | ORAL | Status: AC

## 2014-12-19 NOTE — Progress Notes (Addendum)
[place]  [phone]) [phone] [phone]     ID: Dustin Frazier OB: May 03, 1940  MR#: [id number]  KNL#:[id number]  Patient Care Team: [name], MD as PCP - General (Family Medicine)  CHIEF COMPLAINT/DIAGNOSIS:  1. 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 Nov 2011 -  PSA up to 208. Repeat bone scan showed progression of bone metastasis with new left rib lesions and worsening vertebral disease. Started oral Zytiga and Prednisone December 2011. 04/14/12 Bone Scan. IMPRESSION:  Findings consistent with widespread bony metastatic disease especially in the spine, pelvis, ribs, femoral shafts and in the right humerus. New abnormal localization is seen within the left mandible raising possibility of necrosis of jaw. Evaluated at [facility] oral surgery and felt not to have osteonecrosis.  Tried Xtandi in 2014. 03/23/13 - serum PSA upto 61.2, also found to have progressive disease.  Pathology Report of the left Mandible Curettage done at [facility] on 04/13/13 - acute osteomyelitis. Zytiga stopped early 2015. Started on Xofigo ([facility]) by Dr.Chrystal Feb 2015, completed 6 doses. Continues on Lupron therapy and is on Xgeva for Bone-related therapy. Starting palliative Taxotere chemotherapy on 12/13/14.  2. 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 Metastatic prostate cancer versus lung cancer). If he has lung cancer, then it is at least cT3 cN1 (7 mm left hilar node on CT scan) cMx (given widespread bone metastasis which could be from prostate primary or lung primary or both).   HISTORY OF PRESENT ILLNESS:  Patient returns for continued oncology follow-up and  make further plan of management. States that he continues to have cough. Also has noticed persistent vertigo and imbalance, states that he does take increased water intake and does not feel that he is dehydrated. He has generalized fatigue and dyspnea on exertion. Denies any fever or chills. Denies any falls or loss of consciousness. Denies earache or tinnitus. No nausea or vomiting. No urinary symptoms, no hematuria. Tries to remain physically active.   REVIEW OF SYSTEMS:   ROS As in HPI above. In addition, no fevers or chills. No new headaches or focal weakness.  No new mood disturbances. No angina or palpitation. No abdominal pain, constipation, diarrhea, dysuria or hematuria. No new skin rash or bleeding symptoms. No new paresthesias in extremities. No polyuria polydipsia. PS ECOG 1.  PAST MEDICAL HISTORY: Reviewed. Past Medical History  Diagnosis Date  . 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 Pneumonia   . Arthritis   . 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  Bone scan 2008 also showed metastatic disease, patient has been on Lupron injections and getting Zometa infusions at urologist office.  Per records sent by Dr. Jacqlyn Larsen, PSA had shown response to Casodex treatment and had dropped from 13-9, subsequently in December of 2010 PSA went back up to 16.  Repeat PSA on July 13, 2009 Had increased to 35.9.  Pathology Report of the left Mandible Curettage done at [facility] on 04/13/13 - acute osteomyelitis.  PAST SURGICAL HISTORY: Reviewed. As above  FAMILY HISTORY: Reviewed. Noncontributory, denies malignancy.  SOCIAL HISTORY: Reviewed. Chronic smoker 30-pack-year history, does take alcohol on  a regular basis mostly beer.  Denies recreational drug usage.  No Known Allergies  Current Outpatient Prescriptions  Medication Sig Dispense Refill  . aspirin 81 MG tablet Take 81 mg by mouth daily.    .  calcium gluconate 500 MG tablet Take 1 tablet by mouth 3 (three) times daily.    . chlorhexidine (PERIDEX) 0.12 % solution 1 mL by Mouth Rinse route at bedtime.    Marland Kitchen dexamethasone (DECADRON) 4 MG tablet Take 2 tablets twice a day the day before chemo, the day of chemo and day after chemo with each treatment 72 tablet 0  . doxazosin (CARDURA) 2 MG tablet Take 2 mg by mouth at bedtime.     . ferrous sulfate 325 (65 FE) MG tablet Take 325 mg by mouth 2 (two) times daily with a meal.    . leuprolide (LUPRON) 11.25 MG KIT injection Inject 1 each into the muscle every 3 (three) months.    Marland Kitchen lisinopril-hydrochlorothiazide (PRINZIDE,ZESTORETIC) 10-12.5 MG per tablet Take by mouth.    . meclizine (ANTIVERT) 25 MG tablet Take 1 tablet (25 mg total) by mouth 3 (three) times daily as needed for dizziness. 60 tablet 1  . Multiple Vitamin (MULTI-VITAMINS) TABS Take 1 tablet by mouth daily.    . naproxen (NAPROSYN) 500 MG tablet Take 500 mg by mouth 2 (two) times daily with a meal.    . OxyCODONE HCl, Abuse Deter, (OXAYDO) 5 MG TABA Take 1 tablet by mouth every 4 (four) hours as needed.    . Potassium Chloride Crys CR (KLOR-CON M10 PO) Take 10 mEq by mouth daily.     . predniSONE (DELTASONE) 5 MG tablet Take 1 tablet (5 mg total) by mouth 2 (two) times daily. 60 tablet 3   No current facility-administered medications for this visit.    PHYSICAL EXAM:  ECOG FS:1 - Symptomatic but completely ambulatory GENERAL: Patient is weak-looking, otherwise alert and oriented and in no acute distress. There is no icterus. HEENT: EOMs intact. Oral exam negative for thrush or lesions, mouth is moist. No cervical lymphadenopathy. CVS: S1S2, regular LUNGS: Bilaterally good air entry, no creps, occasional rhonchi. ABDOMEN: Soft, nontender.  EXTREMITIES: No pedal edema. NEURO: Grossly nonfocal, cranial nerves intact, gait is unremarkable   LAB RESULTS:    Component Value Date/Time   NA 138 12/12/2014 1028   NA 140  10/06/2013 0949   K 4.1 12/12/2014 1028   K 3.7 10/06/2013 0949   CL 105 12/12/2014 1028   CL 103 10/06/2013 0949   CO2 25 12/12/2014 1028   CO2 29 10/06/2013 0949   GLUCOSE 127* 12/12/2014 1028   GLUCOSE 116* 10/06/2013 0949   BUN 18 12/12/2014 1028   BUN 13 10/06/2013 0949   CREATININE 0.77 12/12/2014 1028   CREATININE 0.83 04/07/2014 0929   CALCIUM 7.7* 12/12/2014 1028   CALCIUM 9.2 04/07/2014 0929   PROT 6.3* 12/12/2014 1028   PROT 6.6 04/07/2014 0929   ALBUMIN 3.1* 12/12/2014 1028   ALBUMIN 3.5 04/07/2014 0929   AST 37 12/12/2014 1028   AST 17 04/07/2014 0929   ALT 51 12/12/2014 1028   ALT 25 04/07/2014 0929   ALKPHOS 285* 12/12/2014 1028   ALKPHOS 99 04/07/2014 0929   BILITOT 0.6 12/12/2014 1028   BILITOT 0.4 04/07/2014 0929   GFRNONAA >60 12/12/2014 1028   GFRNONAA >60 04/07/2014 0929   GFRNONAA >60 01/13/2014 0931   GFRAA >60 12/12/2014 1028   GFRAA >60 04/07/2014 0929   GFRAA >60 01/13/2014 1448  Lab Results  Component Value Date   WBC 5.6 12/12/2014   NEUTROABS 4.9 12/12/2014   HGB 8.3* 12/12/2014   HCT 25.2* 12/12/2014   MCV 95.0 12/12/2014   PLT 128* 12/12/2014     STUDIES: 04/07/14 - serum PSA is 405.2 (was 318.6 on 01/13/14, was 148.9 on 10/21/13, 75.6 on 06/17/13, 59.7 on 05/06/13, 61.2 on 03/23/13, 38 on 01/28/13, 25.2 on 11/12/12).  02/03/13. Bone scan. IMPRESSION:  Numerous foci of increased radiotracer uptake throughout the axial and appendicular skeleton most concerning for metastatic disease similar in appearance to the prior examination.  01/06/14 - Bone Scan. IMPRESSION:  Extensive osseous metastatic disease with overall progression from the prior study.  11/03/14 - CXR. IMPRESSION:  1. Emphysema. No active lung disease.  2. Widespread blastic bone metastases, which have progressed since the chest x-ray of 08/10/2009.  11/09/14 - CT abdomen/pelvis. IMPRESSION:  1. Widespread metastatic disease to the bones redemonstrated. This appears very similar  to the prior examination. Continued enlargement of large right adrenal mass remains highly concerning for adrenal metastasis. 2. Today's study demonstrates 2 large pulmonary nodules in the left upper lobe which have an aggressive appearance. While it is conceivable that one or both of these areas of apparent nodularity could be resolving areas of post infectious/inflammatory scarring, neoplasm is more strongly favored. The appearance is suspicious in particular for primary bronchogenic neoplasm, particularly with regard to the larger of these 2 nodules (image 30 of series 2). Metastatic disease is not excluded, but is not favored on the basis of the appearance on today's examination. Correlation with biopsy could be considered if clinically appropriate. 3. Circumferential thickening of the urinary bladder on today's examination. This could be treatment related, or secondary to urinary tract infection. Clinical correlation is recommended. 4. Cholelithiasis without evidence of acute cholecystitis at this time. 5. Atherosclerosis, including left main and 3 vessel coronary artery disease. Assessment for potential risk factor modification, dietary therapy or pharmacologic therapy may be warranted, if clinically indicated. 6. Additional incidental findings, as above.   11/09/14 - CT Chest. IMPRESSION:  1. Widespread metastatic disease to the bones redemonstrated. This appears very similar to the prior examination. Continued enlargement of large right adrenal mass remains highly concerning for adrenal metastasis. 2. Today's study demonstrates 2 large pulmonary nodules in the left upper lobe which have an aggressive appearance. While it is conceivable that one or both of these areas of apparent nodularity could be resolving areas of post infectious/inflammatory scarring, neoplasm is more strongly favored. The appearance is suspicious in particular for primary bronchogenic neoplasm, particularly with regard to the larger  of these 2 nodules (image 30 of series 2). Metastatic disease is not excluded, but is not favored on the basis of the appearance on today's examination. Correlation with biopsy could be considered if clinically appropriate. 3. Circumferential thickening of the urinary bladder on today's examination. This could be treatment related, or secondary to urinary tract infection. Clinical correlation is recommended. 4. Cholelithiasis without evidence of acute cholecystitis at this time. 5. Atherosclerosis, including left main and 3 vessel coronary artery disease. Assessment for potential risk factor modification, dietary therapy or pharmacologic therapy may be warranted, if clinically indicated. 6. Additional incidental findings, as above.  11/22/14 - Bronchoscopy/Biopsy Pathology Report -   DIAGNOSIS:  A. LUNG NODULE, LEFT UPPER LOBE; FORCEPS BIOPSY:  - EPITHELIOID NEOPLASM (SEE COMMENT).  Comment:  Due to the challenging nature of this case an external cytopathology  consultation was obtained from [facility]. The touch preparations  of the biopsy  show scattered clusters of atypical epithelioid cells with large nuclei,  prominent nucleoli, and moderate to abundant cytoplasm. The morphology is identical to that seen in the biopsy. The biopsy demonstrates a proliferation of epithelioid cells within fibrotic stroma lined by benign bronchial cells. The cells appear to be forming acini/glandular structures. No necrosis is seen and mitotic figures are not prominent.  Immunohistochemical stains were performed at [facility] with the following results:  Cytokeratin AE1/AE3: Focal, weak, cytoplasmic reactivity, may be  nonspecific  Cytokeratin 7: Negative  Cytokeratin 20: Negative  TT F-1: Negative  p40: Negative  PSA: Equivocal, weak cytoplasmic staining in a moderate number of cells  PSAP: Negative  Synoptic 5 sent: Negative  Chromogranin: Negative  CD45: Negative  S-100: Negative  Smooth muscle actin: Positive  Vimentin:  Positive  Desmin: Negative  CD34: Negative  Overall, the immunostaining pattern is nonspecific. Although the neoplasm appears epithelioid, cytokeratin AE1/AE3 staining appears  nonspecific and the only convincingly positive immunostains are smooth muscle actin and vimentin. PSA is equivocal with weak cytoplasmic reactivity, but may be positive; PSAP negative. Metastatic prostate carcinoma cannot be entirely excluded, the sample is limited and precludes definitive diagnosis.  TTF-1-1 can be negative and up to 25% of lung adenocarcinoma, but the combination of negative TTF-1-1 and negative cytokeratin 7 mixed a primary lung adenocarcinoma less likely. Additional material is recommended to further characterize the neoplasm. Please see concurrent specimen reports [id number]; [id number]; [id number].    ASSESSMENT / PLAN:   1. Stage IV hormone-refractory metastatic prostate cancer. Patient had recent rise in PSA upto 61.2 from 38. Bone Scan showed findings consistent with widespread bony metastatic disease especially in the spine, pelvis, ribs, femoral shafts and in the right humerus with no significant change. CT abdomen/pelvis also showed some left sided asymmetry in prostate but not radically changed. He recently has completed radium treatment Trudi Ida) by Dr. Baruch Gouty. Serum PSA continues to rise -   have explained to patient that metastatic prostate cancer seems to be rapidly progressive and he has widespread bone metastasis. He is agreeable to start on chemotherapy with palliative Taxotere 75 mg/m IV given once every 3 weeks 4-6 cycles as tolerated and if he responds. Have explained palliative intent of treatment, possible response rates and possible side effects, he is agreeable and expressed verbal consent to take this treatment. Monitor weekly labs and follow-up in 3 weeks to plan cycle 2 chemotherapy. 2. Newly diagnosed lung cancer versus lung metastasis from prostate cancer (bronchoscopy/biopsy done  on 11/22/14 final pathology report after second opinion from [facility] is Epithelioid Neoplasm with immunostaining pattern is nonspecific,? Metastatic prostate cancer versus lung cancer). If he has lung cancer, then it is at least cT3 cN1 (7 mm left hilar node on CT scan) cMx (given widespread bone metastasis which could be from prostate primary or lung primary or both) -  continues to have significant cough. Given possibility of lung cancer versus other etiology, plan is to pursue radiation therapy for lung lesions and he is seeing Dr. Baruch Gouty for this, also starting on Taxotere for metastatic prostate cancer which hopefully will help if he also has lung cancer.   3. Anemia, thrombocytopenia - patient is asymptomatic. Blood counts have partially improved after coming off of alcohol, patient advised to completely avoid taking alcohol. Continue to monitor. 4. Vertigo - will get MRI brain to rule out brain metastasis, and try meclizine 25 mg 3 times a day prn for vertigo. If symptoms persist and MRI brain  is negative for metastasis, we will request ENT evaluation. 5. Bone-related therapy - also continue on Xgeva 120 mg SQ every 4 weeks for widespread bone metastasis. 6. In between visits, patient advised to call or come to ER in case of any worsening symptoms or sickness. Patient agreeable to this plan.   Leia Alf, MD   12/19/2014 4:15 PM

## 2014-12-19 NOTE — Telephone Encounter (Signed)
Requesting to speak with Judeen Hammans.

## 2014-12-19 NOTE — Telephone Encounter (Signed)
Pt had called to get results of mri scan.  He is also fatigued and wondered if he could increase the prednisone or inc. The dexamethasone to help with fatigue.  Also he does not have any more refills of prednisone and would like more called in.  I spoke to pandit and pt brain has no metastasis but his skull bone shows metastatic disease from his prostate.  Dr. Ma Hillock does not want to change his steroids because he takes prednisone every day and 3 days a week he takes dexamethasone also.  He feels that his fatigue is coming from chemo side effects as well as disease as well as hgb lower.  Pt wondered about his dizziness still happens some and wondered if he should get the ENT ref. They spoke about when he was in the office before mri ordered.  I will check on that and order one if pandit agrees, called in prednisone to pharmacy.

## 2014-12-20 ENCOUNTER — Inpatient Hospital Stay: Payer: Medicare Other

## 2014-12-20 ENCOUNTER — Other Ambulatory Visit: Payer: Self-pay | Admitting: Internal Medicine

## 2014-12-20 DIAGNOSIS — C7951 Secondary malignant neoplasm of bone: Principal | ICD-10-CM

## 2014-12-20 DIAGNOSIS — C61 Malignant neoplasm of prostate: Secondary | ICD-10-CM

## 2014-12-20 DIAGNOSIS — Z5111 Encounter for antineoplastic chemotherapy: Secondary | ICD-10-CM | POA: Diagnosis not present

## 2014-12-20 LAB — CBC WITH DIFFERENTIAL/PLATELET
Basophils Absolute: 0 10*3/uL (ref 0–0.1)
Basophils Relative: 1 %
EOS ABS: 0 10*3/uL (ref 0–0.7)
HCT: 25.5 % — ABNORMAL LOW (ref 40.0–52.0)
Hemoglobin: 8.5 g/dL — ABNORMAL LOW (ref 13.0–18.0)
LYMPHS ABS: 0.3 10*3/uL — AB (ref 1.0–3.6)
Lymphocytes Relative: 29 %
MCH: 31.1 pg (ref 26.0–34.0)
MCHC: 33.2 g/dL (ref 32.0–36.0)
MCV: 93.7 fL (ref 80.0–100.0)
MONO ABS: 0.3 10*3/uL (ref 0.2–1.0)
Neutro Abs: 0.5 10*3/uL — ABNORMAL LOW (ref 1.4–6.5)
PLATELETS: 144 10*3/uL — AB (ref 150–440)
RBC: 2.72 MIL/uL — ABNORMAL LOW (ref 4.40–5.90)
RDW: 17.9 % — ABNORMAL HIGH (ref 11.5–14.5)
WBC: 1.1 10*3/uL — CL (ref 3.8–10.6)

## 2014-12-20 MED ORDER — AMOXICILLIN-POT CLAVULANATE 875-125 MG PO TABS: 1.0000 | ORAL_TABLET | Freq: Two times a day (BID) | ORAL | Status: DC

## 2014-12-21 ENCOUNTER — Other Ambulatory Visit: Payer: Self-pay | Admitting: Family Medicine

## 2014-12-21 DIAGNOSIS — C61 Malignant neoplasm of prostate: Secondary | ICD-10-CM

## 2014-12-21 DIAGNOSIS — C7951 Secondary malignant neoplasm of bone: Principal | ICD-10-CM

## 2014-12-21 MED ORDER — AMOXICILLIN-POT CLAVULANATE 875-125 MG PO TABS: 1.0000 | ORAL_TABLET | Freq: Two times a day (BID) | ORAL | Status: DC

## 2014-12-22 DIAGNOSIS — Z510 Encounter for antineoplastic radiation therapy: Secondary | ICD-10-CM | POA: Diagnosis not present

## 2014-12-26 ENCOUNTER — Telehealth: Payer: Self-pay | Admitting: Pain Medicine

## 2014-12-26 ENCOUNTER — Ambulatory Visit
Admission: RE | Admit: 2014-12-26 | Discharge: 2014-12-26 | Disposition: A | Discharge status: Home / Self Care | Payer: Medicare Other | Source: Ambulatory Visit | Attending: Radiation Oncology | Admitting: Radiation Oncology

## 2014-12-26 ENCOUNTER — Ambulatory Visit: Payer: Medicare Other | Admitting: Internal Medicine

## 2014-12-26 DIAGNOSIS — Z510 Encounter for antineoplastic radiation therapy: Secondary | ICD-10-CM | POA: Diagnosis not present

## 2014-12-26 NOTE — Telephone Encounter (Signed)
Patient will be out of meds on Aug 14th / has appt here with Dr. Dossie Arbour on Oct 3 at 9:20 / please check with Dr. Dossie Arbour on what to advise patient

## 2014-12-27 ENCOUNTER — Other Ambulatory Visit: Payer: Self-pay | Admitting: Internal Medicine

## 2014-12-27 ENCOUNTER — Ambulatory Visit
Admission: RE | Admit: 2014-12-27 | Discharge: 2014-12-27 | Disposition: A | Discharge status: Home / Self Care | Payer: Medicare Other | Source: Ambulatory Visit | Attending: Radiation Oncology | Admitting: Radiation Oncology

## 2014-12-27 ENCOUNTER — Inpatient Hospital Stay: Payer: Medicare Other

## 2014-12-27 DIAGNOSIS — C61 Malignant neoplasm of prostate: Secondary | ICD-10-CM

## 2014-12-27 DIAGNOSIS — C7951 Secondary malignant neoplasm of bone: Principal | ICD-10-CM

## 2014-12-27 DIAGNOSIS — Z5111 Encounter for antineoplastic chemotherapy: Secondary | ICD-10-CM | POA: Diagnosis not present

## 2014-12-27 DIAGNOSIS — Z510 Encounter for antineoplastic radiation therapy: Secondary | ICD-10-CM | POA: Diagnosis not present

## 2014-12-27 LAB — HEPATIC FUNCTION PANEL
ALT: 23 U/L (ref 17–63)
AST: 24 U/L (ref 15–41)
Albumin: 3 g/dL — ABNORMAL LOW (ref 3.5–5.0)
Alkaline Phosphatase: 157 U/L — ABNORMAL HIGH (ref 38–126)
BILIRUBIN TOTAL: 0.6 mg/dL (ref 0.3–1.2)
Total Protein: 5.7 g/dL — ABNORMAL LOW (ref 6.5–8.1)

## 2014-12-27 LAB — CBC WITH DIFFERENTIAL/PLATELET
BASOS ABS: 0 10*3/uL (ref 0–0.1)
Basophils Relative: 0 %
Eosinophils Absolute: 0 10*3/uL (ref 0–0.7)
Eosinophils Relative: 0 %
HEMATOCRIT: 22.1 % — AB (ref 40.0–52.0)
Hemoglobin: 7.2 g/dL — ABNORMAL LOW (ref 13.0–18.0)
LYMPHS ABS: 0.4 10*3/uL — AB (ref 1.0–3.6)
LYMPHS PCT: 6 %
MCH: 30.9 pg (ref 26.0–34.0)
MCHC: 32.8 g/dL (ref 32.0–36.0)
MCV: 94.2 fL (ref 80.0–100.0)
MONO ABS: 0.3 10*3/uL (ref 0.2–1.0)
MONOS PCT: 4 %
NEUTROS ABS: 7.2 10*3/uL — AB (ref 1.4–6.5)
Neutrophils Relative %: 90 %
Platelets: 115 10*3/uL — ABNORMAL LOW (ref 150–440)
RBC: 2.34 MIL/uL — ABNORMAL LOW (ref 4.40–5.90)
RDW: 18.9 % — AB (ref 11.5–14.5)
WBC: 8 10*3/uL (ref 3.8–10.6)

## 2014-12-27 LAB — BASIC METABOLIC PANEL
ANION GAP: 7 (ref 5–15)
BUN: 15 mg/dL (ref 6–20)
CALCIUM: 7.6 mg/dL — AB (ref 8.9–10.3)
CO2: 23 mmol/L (ref 22–32)
Chloride: 106 mmol/L (ref 101–111)
Creatinine, Ser: 0.58 mg/dL — ABNORMAL LOW (ref 0.61–1.24)
GFR calc Af Amer: >60 mL/min (ref >60)
GFR calc non Af Amer: >60 mL/min (ref >60)
GLUCOSE: 91 mg/dL (ref 65–99)
POTASSIUM: 4.3 mmol/L (ref 3.5–5.1)
Sodium: 136 mmol/L (ref 135–145)

## 2014-12-28 ENCOUNTER — Ambulatory Visit
Admission: RE | Admit: 2014-12-28 | Discharge: 2014-12-28 | Disposition: A | Discharge status: Home / Self Care | Payer: Medicare Other | Source: Ambulatory Visit | Attending: Radiation Oncology | Admitting: Radiation Oncology

## 2014-12-28 DIAGNOSIS — Z510 Encounter for antineoplastic radiation therapy: Secondary | ICD-10-CM | POA: Diagnosis not present

## 2014-12-29 ENCOUNTER — Ambulatory Visit
Admission: RE | Admit: 2014-12-29 | Discharge: 2014-12-29 | Disposition: A | Discharge status: Home / Self Care | Payer: Medicare Other | Source: Ambulatory Visit | Attending: Radiation Oncology | Admitting: Radiation Oncology

## 2014-12-29 DIAGNOSIS — Z510 Encounter for antineoplastic radiation therapy: Secondary | ICD-10-CM | POA: Diagnosis not present

## 2014-12-30 ENCOUNTER — Ambulatory Visit
Admission: RE | Admit: 2014-12-30 | Discharge: 2014-12-30 | Disposition: A | Discharge status: Home / Self Care | Payer: Medicare Other | Source: Ambulatory Visit | Attending: Radiation Oncology | Admitting: Radiation Oncology

## 2014-12-30 DIAGNOSIS — Z510 Encounter for antineoplastic radiation therapy: Secondary | ICD-10-CM | POA: Diagnosis not present

## 2015-01-03 ENCOUNTER — Inpatient Hospital Stay: Payer: Medicare Other

## 2015-01-03 ENCOUNTER — Inpatient Hospital Stay: Payer: Medicare Other | Attending: Internal Medicine

## 2015-01-03 ENCOUNTER — Ambulatory Visit
Admission: RE | Admit: 2015-01-03 | Discharge: 2015-01-03 | Disposition: A | Discharge status: Home / Self Care | Payer: Medicare Other | Source: Ambulatory Visit | Attending: Radiation Oncology | Admitting: Radiation Oncology

## 2015-01-03 ENCOUNTER — Inpatient Hospital Stay (HOSPITAL_BASED_OUTPATIENT_CLINIC_OR_DEPARTMENT_OTHER): Payer: Medicare Other | Admitting: Internal Medicine

## 2015-01-03 ENCOUNTER — Telehealth: Payer: Self-pay | Admitting: *Deleted

## 2015-01-03 VITALS — BP 123/77 | HR 79 | Temp 96.7°F | Resp 18

## 2015-01-03 VITALS — BP 133/81 | HR 66 | Temp 96.1°F | Resp 18 | Ht 69.0 in | Wt 156.1 lb

## 2015-01-03 DIAGNOSIS — C7951 Secondary malignant neoplasm of bone: Secondary | ICD-10-CM | POA: Insufficient documentation

## 2015-01-03 DIAGNOSIS — C61 Malignant neoplasm of prostate: Secondary | ICD-10-CM

## 2015-01-03 DIAGNOSIS — I10 Essential (primary) hypertension: Secondary | ICD-10-CM | POA: Diagnosis not present

## 2015-01-03 DIAGNOSIS — R911 Solitary pulmonary nodule: Secondary | ICD-10-CM | POA: Insufficient documentation

## 2015-01-03 DIAGNOSIS — F17210 Nicotine dependence, cigarettes, uncomplicated: Secondary | ICD-10-CM | POA: Insufficient documentation

## 2015-01-03 DIAGNOSIS — Z79899 Other long term (current) drug therapy: Secondary | ICD-10-CM | POA: Diagnosis not present

## 2015-01-03 DIAGNOSIS — D649 Anemia, unspecified: Secondary | ICD-10-CM

## 2015-01-03 DIAGNOSIS — R42 Dizziness and giddiness: Secondary | ICD-10-CM | POA: Insufficient documentation

## 2015-01-03 DIAGNOSIS — Z23 Encounter for immunization: Secondary | ICD-10-CM | POA: Diagnosis not present

## 2015-01-03 DIAGNOSIS — Z510 Encounter for antineoplastic radiation therapy: Secondary | ICD-10-CM | POA: Diagnosis not present

## 2015-01-03 LAB — CBC WITH DIFFERENTIAL/PLATELET
BASOS PCT: 0 %
Basophils Absolute: 0 10*3/uL (ref 0–0.1)
Eosinophils Absolute: 0 10*3/uL (ref 0–0.7)
Eosinophils Relative: 0 %
HEMATOCRIT: 20.4 % — AB (ref 40.0–52.0)
HEMOGLOBIN: 6.6 g/dL — AB (ref 13.0–18.0)
LYMPHS ABS: 0.4 10*3/uL — AB (ref 1.0–3.6)
Lymphocytes Relative: 7 %
MCH: 31.3 pg (ref 26.0–34.0)
MCHC: 32.5 g/dL (ref 32.0–36.0)
MCV: 96.1 fL (ref 80.0–100.0)
MONO ABS: 0.4 10*3/uL (ref 0.2–1.0)
MONOS PCT: 6 %
NEUTROS ABS: 5.2 10*3/uL (ref 1.4–6.5)
Neutrophils Relative %: 87 %
Platelets: 109 10*3/uL — ABNORMAL LOW (ref 150–440)
RBC: 2.13 MIL/uL — ABNORMAL LOW (ref 4.40–5.90)
RDW: 19.8 % — AB (ref 11.5–14.5)
WBC: 6 10*3/uL (ref 3.8–10.6)

## 2015-01-03 LAB — CREATININE, SERUM
Creatinine, Ser: 0.72 mg/dL (ref 0.61–1.24)
GFR calc Af Amer: >60 mL/min (ref >60)

## 2015-01-03 LAB — SAMPLE TO BLOOD BANK

## 2015-01-03 LAB — ABO/RH: ABO/RH(D): O NEG

## 2015-01-03 LAB — PREPARE RBC (CROSSMATCH)

## 2015-01-03 MED ORDER — DIPHENHYDRAMINE HCL 25 MG PO CAPS
25.0000 mg | ORAL_CAPSULE | Freq: Once | ORAL | Status: AC
  Administered 2015-01-03: 25 mg via ORAL
  Filled 2015-01-03: qty 1

## 2015-01-03 MED ORDER — ACETAMINOPHEN 325 MG PO TABS
650.0000 mg | ORAL_TABLET | Freq: Once | ORAL | Status: AC
  Administered 2015-01-03: 650 mg via ORAL
  Filled 2015-01-03: qty 2

## 2015-01-03 MED ORDER — SODIUM CHLORIDE 0.9 % IV SOLN
250.0000 mL | Freq: Once | INTRAVENOUS | Status: AC
  Administered 2015-01-03: 250 mL via INTRAVENOUS
  Filled 2015-01-03: qty 250

## 2015-01-03 NOTE — Telephone Encounter (Signed)
Pt discussed swelling of lower ext.  Dr. Ma Hillock gave me verbal order for furosemide  62mg tablets. Take 1 daily if needed for leg swelling # 30.  No refills and it was called into his pharmacy

## 2015-01-03 NOTE — Progress Notes (Signed)
Patient is here for follow-up and chemo treatment. Patient states that his energy has actually been pretty good, despite his Hgb dropping some more.

## 2015-01-04 ENCOUNTER — Inpatient Hospital Stay: Payer: Medicare Other

## 2015-01-04 ENCOUNTER — Ambulatory Visit
Admission: RE | Admit: 2015-01-04 | Discharge: 2015-01-04 | Disposition: A | Discharge status: Home / Self Care | Payer: Medicare Other | Source: Ambulatory Visit | Attending: Radiation Oncology | Admitting: Radiation Oncology

## 2015-01-04 VITALS — BP 111/64 | HR 54 | Temp 97.1°F | Resp 18

## 2015-01-04 DIAGNOSIS — Z510 Encounter for antineoplastic radiation therapy: Secondary | ICD-10-CM | POA: Diagnosis not present

## 2015-01-04 DIAGNOSIS — C7951 Secondary malignant neoplasm of bone: Principal | ICD-10-CM

## 2015-01-04 DIAGNOSIS — C61 Malignant neoplasm of prostate: Secondary | ICD-10-CM

## 2015-01-04 LAB — TYPE AND SCREEN
ABO/RH(D): O NEG
Antibody Screen: NEGATIVE
Unit division: 0
Unit division: 0

## 2015-01-04 MED ORDER — DEXAMETHASONE SODIUM PHOSPHATE 100 MG/10ML IJ SOLN
Freq: Once | INTRAMUSCULAR | Status: AC
  Administered 2015-01-04: 09:00:00 via INTRAVENOUS
  Filled 2015-01-04: qty 8

## 2015-01-04 MED ORDER — DIPHENHYDRAMINE HCL 50 MG/ML IJ SOLN
25.0000 mg | Freq: Once | INTRAMUSCULAR | Status: AC
  Administered 2015-01-04: 25 mg via INTRAVENOUS
  Filled 2015-01-04: qty 1

## 2015-01-04 MED ORDER — FAMOTIDINE IN NACL 20-0.9 MG/50ML-% IV SOLN
20.0000 mg | Freq: Two times a day (BID) | INTRAVENOUS | Status: DC
Start: 2015-01-04 — End: 2015-01-04
  Administered 2015-01-04: 20 mg via INTRAVENOUS
  Filled 2015-01-04: qty 50

## 2015-01-04 MED ORDER — DOCETAXEL CHEMO INJECTION 160 MG/16ML: 75.0000 mg/m2 | Freq: Once | INTRAVENOUS | Status: DC

## 2015-01-04 MED ORDER — SODIUM CHLORIDE 0.9 % IV SOLN
Freq: Once | INTRAVENOUS | Status: AC
  Administered 2015-01-04: 09:00:00 via INTRAVENOUS
  Filled 2015-01-04: qty 1000

## 2015-01-04 MED ORDER — SODIUM CHLORIDE 0.9 % IV SOLN
75.0000 mg/m2 | Freq: Once | INTRAVENOUS | Status: AC
  Administered 2015-01-04: 140 mg via INTRAVENOUS
  Filled 2015-01-04: qty 14

## 2015-01-05 ENCOUNTER — Ambulatory Visit
Admission: RE | Admit: 2015-01-05 | Discharge: 2015-01-05 | Disposition: A | Discharge status: Home / Self Care | Payer: Medicare Other | Source: Ambulatory Visit | Attending: Radiation Oncology | Admitting: Radiation Oncology

## 2015-01-05 DIAGNOSIS — Z510 Encounter for antineoplastic radiation therapy: Secondary | ICD-10-CM | POA: Diagnosis not present

## 2015-01-05 LAB — ACID FAST SMEAR+CULTURE W/RFLX (ARMC ONLY)
ACID FAST SMEAR: NEGATIVE
Acid Fast Culture: NEGATIVE

## 2015-01-06 ENCOUNTER — Encounter: Payer: Self-pay | Admitting: Internal Medicine

## 2015-01-06 ENCOUNTER — Ambulatory Visit
Admission: RE | Admit: 2015-01-06 | Discharge: 2015-01-06 | Disposition: A | Discharge status: Home / Self Care | Payer: Medicare Other | Source: Ambulatory Visit | Attending: Radiation Oncology | Admitting: Radiation Oncology

## 2015-01-06 DIAGNOSIS — Z510 Encounter for antineoplastic radiation therapy: Secondary | ICD-10-CM | POA: Diagnosis not present

## 2015-01-09 ENCOUNTER — Ambulatory Visit
Admission: RE | Admit: 2015-01-09 | Discharge: 2015-01-09 | Disposition: A | Discharge status: Home / Self Care | Payer: Medicare Other | Source: Ambulatory Visit | Attending: Radiation Oncology | Admitting: Radiation Oncology

## 2015-01-09 DIAGNOSIS — Z510 Encounter for antineoplastic radiation therapy: Secondary | ICD-10-CM | POA: Diagnosis not present

## 2015-01-10 ENCOUNTER — Ambulatory Visit
Admission: RE | Admit: 2015-01-10 | Discharge: 2015-01-10 | Disposition: A | Discharge status: Home / Self Care | Payer: Medicare Other | Source: Ambulatory Visit | Attending: Radiation Oncology | Admitting: Radiation Oncology

## 2015-01-10 DIAGNOSIS — Z510 Encounter for antineoplastic radiation therapy: Secondary | ICD-10-CM | POA: Diagnosis not present

## 2015-01-11 ENCOUNTER — Telehealth: Payer: Self-pay | Admitting: *Deleted

## 2015-01-11 ENCOUNTER — Inpatient Hospital Stay: Payer: Medicare Other

## 2015-01-11 DIAGNOSIS — C7951 Secondary malignant neoplasm of bone: Principal | ICD-10-CM

## 2015-01-11 DIAGNOSIS — C61 Malignant neoplasm of prostate: Secondary | ICD-10-CM | POA: Diagnosis not present

## 2015-01-11 LAB — CBC WITH DIFFERENTIAL/PLATELET
BASOS PCT: 1 %
Basophils Absolute: 0 10*3/uL (ref 0–0.1)
EOS ABS: 0 10*3/uL (ref 0–0.7)
EOS PCT: 3 %
HEMATOCRIT: 25.4 % — AB (ref 40.0–52.0)
Hemoglobin: 8.2 g/dL — ABNORMAL LOW (ref 13.0–18.0)
Lymphocytes Relative: 14 %
Lymphs Abs: 0.1 10*3/uL — ABNORMAL LOW (ref 1.0–3.6)
MCH: 31.5 pg (ref 26.0–34.0)
MCHC: 32.5 g/dL (ref 32.0–36.0)
MCV: 97.1 fL (ref 80.0–100.0)
MONO ABS: 0.2 10*3/uL (ref 0.2–1.0)
Monocytes Relative: 26 %
NEUTROS ABS: 0.6 10*3/uL — AB (ref 1.4–6.5)
NEUTROS PCT: 56 %
PLATELETS: 91 10*3/uL — AB (ref 150–440)
RBC: 2.61 MIL/uL — ABNORMAL LOW (ref 4.40–5.90)
RDW: 19.1 % — AB (ref 11.5–14.5)
SMEAR REVIEW: DECREASED
WBC: 0.9 10*3/uL — CL (ref 3.8–10.6)

## 2015-01-11 LAB — SAMPLE TO BLOOD BANK

## 2015-01-11 MED ORDER — AMOXICILLIN-POT CLAVULANATE 875-125 MG PO TABS: 1.0000 | ORAL_TABLET | Freq: Two times a day (BID) | ORAL | Status: DC

## 2015-01-11 NOTE — Telephone Encounter (Signed)
Electronic send in augmentin for pt with low anc and coughing up sputum per Dr. Ma Hillock req.

## 2015-01-18 ENCOUNTER — Inpatient Hospital Stay: Payer: Medicare Other

## 2015-01-18 ENCOUNTER — Other Ambulatory Visit: Payer: Self-pay | Admitting: *Deleted

## 2015-01-18 DIAGNOSIS — C61 Malignant neoplasm of prostate: Secondary | ICD-10-CM

## 2015-01-18 DIAGNOSIS — C7951 Secondary malignant neoplasm of bone: Principal | ICD-10-CM

## 2015-01-18 LAB — BASIC METABOLIC PANEL
ANION GAP: 7 (ref 5–15)
BUN: 16 mg/dL (ref 6–20)
CALCIUM: 9.3 mg/dL (ref 8.9–10.3)
CO2: 25 mmol/L (ref 22–32)
Chloride: 108 mmol/L (ref 101–111)
Creatinine, Ser: 0.69 mg/dL (ref 0.61–1.24)
GFR calc Af Amer: >60 mL/min (ref >60)
GLUCOSE: 93 mg/dL (ref 65–99)
POTASSIUM: 5 mmol/L (ref 3.5–5.1)
SODIUM: 140 mmol/L (ref 135–145)

## 2015-01-18 LAB — HEPATIC FUNCTION PANEL
ALT: 15 U/L — ABNORMAL LOW (ref 17–63)
AST: 22 U/L (ref 15–41)
Albumin: 2.9 g/dL — ABNORMAL LOW (ref 3.5–5.0)
Alkaline Phosphatase: 99 U/L (ref 38–126)
TOTAL PROTEIN: 5.9 g/dL — AB (ref 6.5–8.1)
Total Bilirubin: 0.3 mg/dL (ref 0.3–1.2)

## 2015-01-18 LAB — CBC WITH DIFFERENTIAL/PLATELET
BASOS ABS: 0 10*3/uL (ref 0–0.1)
Basophils Relative: 0 %
EOS ABS: 0 10*3/uL (ref 0–0.7)
HCT: 22.6 % — ABNORMAL LOW (ref 40.0–52.0)
Hemoglobin: 7.2 g/dL — ABNORMAL LOW (ref 13.0–18.0)
Lymphs Abs: 0.3 10*3/uL — ABNORMAL LOW (ref 1.0–3.6)
MCH: 30.9 pg (ref 26.0–34.0)
MCHC: 31.8 g/dL — ABNORMAL LOW (ref 32.0–36.0)
MCV: 97 fL (ref 80.0–100.0)
MONO ABS: 0.3 10*3/uL (ref 0.2–1.0)
Monocytes Relative: 7 %
Neutro Abs: 4.2 10*3/uL (ref 1.4–6.5)
Neutrophils Relative %: 87 %
PLATELETS: 81 10*3/uL — AB (ref 150–440)
RBC: 2.33 MIL/uL — AB (ref 4.40–5.90)
RDW: 19.3 % — AB (ref 11.5–14.5)
WBC: 4.9 10*3/uL (ref 3.8–10.6)

## 2015-01-18 NOTE — Progress Notes (Signed)
[place]  [phone] [phone]     ID: Dustin Frazier OB: [birth year]  MR#: [id number]  KDX#:[id number]  Patient Care Team: [name], MD as PCP - General (Family Medicine)  CHIEF COMPLAINT/DIAGNOSIS:  1. 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 Nov 2011 -  PSA up to 208. Repeat bone scan showed progression of bone metastasis with new left rib lesions and worsening vertebral disease. Started oral Zytiga and Prednisone December 2011. 04/14/12 Bone Scan. IMPRESSION:  Findings consistent with widespread bony metastatic disease especially in the spine, pelvis, ribs, femoral shafts and in the right humerus. New abnormal localization is seen within the left mandible raising possibility of necrosis of jaw. Evaluated at [facility] oral surgery and felt not to have osteonecrosis.  Tried Xtandi in 2014. 03/23/13 - serum PSA upto 61.2, also found to have progressive disease.  Pathology Report of the left Mandible Curettage done at [facility] on 04/13/13 - acute osteomyelitis. Zytiga stopped early 2015. Started on Xofigo ([facility]) by Dr.Chrystal Feb 2015, completed 6 doses. Continues on Lupron therapy and is on Xgeva for Bone-related therapy. Stared palliative Taxotere chemotherapy on 12/13/14.  2. 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 Metastatic prostate cancer versus lung cancer). If he has lung cancer, then it is at least cT3 cN1 (7 mm left hilar node on CT scan) cMx (given widespread bone metastasis which could be from prostate primary or lung primary or both). Getting XRT.   HISTORY OF PRESENT ILLNESS:  Patient returns for continued oncology follow-up and plan  next chemotherapy. States that he continues to have cough. Also states vertigo and imbalance was better but has worsened again. He has generalized fatigue and dyspnea on exertion. Denies any fever or chills. Denies any falls or loss of consciousness. Denies earache or tinnitus. No nausea or vomiting. No urinary symptoms, no hematuria. Tries to remain physically active.   REVIEW OF SYSTEMS:   ROS As in HPI above. In addition, no fevers. No new headaches or focal weakness.  No new mood disturbances. No angina or palpitation. No abdominal pain, constipation, diarrhea, dysuria or hematuria. No new skin rash or bleeding symptoms. No new paresthesias in extremities. No polyuria polydipsia. PS ECOG 1.  PAST MEDICAL HISTORY: Reviewed. Past Medical History  Diagnosis Date  . 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 Pneumonia   . Arthritis   . 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  Bone scan 2008 also showed metastatic disease, patient has been on Lupron injections and getting Zometa infusions at urologist office.  Per records sent by Dr. Jacqlyn Larsen, PSA had shown response to Casodex treatment and had dropped from 13-9, subsequently in December of 2010 PSA went back up to 16.  Repeat PSA on July 13, 2009 Had increased to 35.9.  Pathology Report of the left Mandible Curettage done at [facility] on 04/13/13 - acute osteomyelitis.  PAST SURGICAL HISTORY: Reviewed. As above  FAMILY HISTORY: Reviewed. Noncontributory, denies malignancy.  SOCIAL HISTORY: Reviewed. Chronic smoker 30-pack-year history, does take alcohol on a regular basis mostly beer.  Denies recreational drug usage.  No Known Allergies  Current Outpatient Prescriptions  Medication Sig Dispense Refill  . aspirin 81 MG tablet Take 81 mg by mouth daily.    . calcium gluconate 500 MG tablet Take 1 tablet by mouth 3 (three) times daily.    .  chlorhexidine (PERIDEX) 0.12 % solution 1 mL by Mouth Rinse route at bedtime.    Marland Kitchen dexamethasone (DECADRON) 4 MG tablet Take 2 tablets twice a day the day before chemo, the day of chemo and day after chemo with each treatment 72 tablet 0  . doxazosin (CARDURA) 2 MG tablet Take 2 mg by mouth at bedtime.     . ferrous sulfate 325 (65 FE) MG tablet Take 325 mg by mouth 2 (two) times daily with a meal.    . leuprolide (LUPRON) 11.25 MG KIT injection Inject 1 each into the muscle every 3 (three) months.    Marland Kitchen lisinopril-hydrochlorothiazide (PRINZIDE,ZESTORETIC) 10-12.5 MG per tablet Take by mouth.    . meclizine (ANTIVERT) 25 MG tablet Take 1 tablet (25 mg total) by mouth 3 (three) times daily as needed for dizziness. 60 tablet 1  . Multiple Vitamin (MULTI-VITAMINS) TABS Take 1 tablet by mouth daily.    . naproxen (NAPROSYN) 500 MG tablet Take 500 mg by mouth 2 (two) times daily with a meal.    . OxyCODONE HCl, Abuse Deter, (OXAYDO) 5 MG TABA Take 1 tablet by mouth every 4 (four) hours as needed.    . Potassium Chloride Crys CR (KLOR-CON M10 PO) Take 10 mEq by mouth daily.     . predniSONE (DELTASONE) 5 MG tablet Take 1 tablet (5 mg total) by mouth 2 (two) times daily. 60 tablet 3  . amoxicillin-clavulanate (AUGMENTIN) 875-125 MG per tablet Take 1 tablet by mouth 2 (two) times daily. 12 tablet 0   No current facility-administered medications for this visit.    PHYSICAL EXAM:  ECOG FS:1 - Symptomatic but completely ambulatory GENERAL: chronically weak-looking, otherwise alert and oriented and in no acute distress. There is no icterus. HEENT: EOMs intact. Oral exam negative for thrush or lesions, mouth is moist.   CVS: S1S2, regular LUNGS: Bilaterally good air entry, occasional rhonchi. ABDOMEN: Soft, nontender.  EXTREMITIES: No pedal edema.   LAB RESULTS:    Component Value Date/Time   NA 140 01/18/2015 0949   NA 140 10/06/2013 0949   K 5.0 01/18/2015 0949   K 3.7 10/06/2013 0949   CL 108  01/18/2015 0949   CL 103 10/06/2013 0949   CO2 25 01/18/2015 0949   CO2 29 10/06/2013 0949   GLUCOSE 93 01/18/2015 0949   GLUCOSE 116* 10/06/2013 0949   BUN 16 01/18/2015 0949   BUN 13 10/06/2013 0949   CREATININE 0.69 01/18/2015 0949   CREATININE 0.83 04/07/2014 0929   CALCIUM 9.3 01/18/2015 0949   CALCIUM 9.2 04/07/2014 0929   PROT 5.9* 01/18/2015 0949   PROT 6.6 04/07/2014 0929   ALBUMIN 2.9* 01/18/2015 0949   ALBUMIN 3.5 04/07/2014 0929   AST 22 01/18/2015 0949   AST 17 04/07/2014 0929   ALT 15* 01/18/2015 0949   ALT 25 04/07/2014 0929   ALKPHOS 99 01/18/2015 0949   ALKPHOS 99 04/07/2014 0929   BILITOT 0.3 01/18/2015 0949   BILITOT 0.4 04/07/2014 0929   GFRNONAA >60 01/18/2015 0949   GFRNONAA >60 04/07/2014 0929   GFRNONAA >60 01/13/2014 0931   GFRAA >60 01/18/2015 0949   GFRAA >60 04/07/2014 0929   GFRAA >60 01/13/2014 0931   Lab Results  Component Value Date  WBC 4.9 01/18/2015   NEUTROABS 4.2 01/18/2015   HGB 7.2* 01/18/2015   HCT 22.6* 01/18/2015   MCV 97.0 01/18/2015   PLT 81* 01/18/2015     STUDIES: 04/07/14 - serum PSA is 405.2 (was 318.6 on 01/13/14, was 148.9 on 10/21/13, 75.6 on 06/17/13, 59.7 on 05/06/13, 61.2 on 03/23/13, 38 on 01/28/13, 25.2 on 11/12/12).  02/03/13. Bone scan. IMPRESSION:  Numerous foci of increased radiotracer uptake throughout the axial and appendicular skeleton most concerning for metastatic disease similar in appearance to the prior examination.  01/06/14 - Bone Scan. IMPRESSION:  Extensive osseous metastatic disease with overall progression from the prior study.  11/03/14 - CXR. IMPRESSION:  1. Emphysema. No active lung disease.  2. Widespread blastic bone metastases, which have progressed since the chest x-ray of 08/10/2009.  11/09/14 - CT abdomen/pelvis. IMPRESSION:  1. Widespread metastatic disease to the bones redemonstrated. This appears very similar to the prior examination. Continued enlargement of large right adrenal mass  remains highly concerning for adrenal metastasis. 2. Today's study demonstrates 2 large pulmonary nodules in the left upper lobe which have an aggressive appearance. While it is conceivable that one or both of these areas of apparent nodularity could be resolving areas of post infectious/inflammatory scarring, neoplasm is more strongly favored. The appearance is suspicious in particular for primary bronchogenic neoplasm, particularly with regard to the larger of these 2 nodules (image 30 of series 2). Metastatic disease is not excluded, but is not favored on the basis of the appearance on today's examination. Correlation with biopsy could be considered if clinically appropriate. 3. Circumferential thickening of the urinary bladder on today's examination. This could be treatment related, or secondary to urinary tract infection. Clinical correlation is recommended. 4. Cholelithiasis without evidence of acute cholecystitis at this time. 5. Atherosclerosis, including left main and 3 vessel coronary artery disease. Assessment for potential risk factor modification, dietary therapy or pharmacologic therapy may be warranted, if clinically indicated. 6. Additional incidental findings, as above.   11/09/14 - CT Chest. IMPRESSION:  1. Widespread metastatic disease to the bones redemonstrated. This appears very similar to the prior examination. Continued enlargement of large right adrenal mass remains highly concerning for adrenal metastasis. 2. Today's study demonstrates 2 large pulmonary nodules in the left upper lobe which have an aggressive appearance. While it is conceivable that one or both of these areas of apparent nodularity could be resolving areas of post infectious/inflammatory scarring, neoplasm is more strongly favored. The appearance is suspicious in particular for primary bronchogenic neoplasm, particularly with regard to the larger of these 2 nodules (image 30 of series 2). Metastatic disease is not  excluded, but is not favored on the basis of the appearance on today's examination. Correlation with biopsy could be considered if clinically appropriate. 3. Circumferential thickening of the urinary bladder on today's examination. This could be treatment related, or secondary to urinary tract infection. Clinical correlation is recommended. 4. Cholelithiasis without evidence of acute cholecystitis at this time. 5. Atherosclerosis, including left main and 3 vessel coronary artery disease. Assessment for potential risk factor modification, dietary therapy or pharmacologic therapy may be warranted, if clinically indicated. 6. Additional incidental findings, as above.  11/22/14 - Bronchoscopy/Biopsy Pathology Report -   DIAGNOSIS:  A. LUNG NODULE, LEFT UPPER LOBE; FORCEPS BIOPSY:  - EPITHELIOID NEOPLASM (SEE COMMENT).  Comment:  Due to the challenging nature of this case an external cytopathology  consultation was obtained from [facility]. The touch preparations of the biopsy  show scattered clusters of  atypical epithelioid cells with large nuclei,  prominent nucleoli, and moderate to abundant cytoplasm. The morphology is identical to that seen in the biopsy. The biopsy demonstrates a proliferation of epithelioid cells within fibrotic stroma lined by benign bronchial cells. The cells appear to be forming acini/glandular structures. No necrosis is seen and mitotic figures are not prominent.  Immunohistochemical stains were performed at [facility] with the following results:  Cytokeratin AE1/AE3: Focal, weak, cytoplasmic reactivity, may be  nonspecific  Cytokeratin 7: Negative  Cytokeratin 20: Negative  TT F-1: Negative  p40: Negative  PSA: Equivocal, weak cytoplasmic staining in a moderate number of cells  PSAP: Negative  Synoptic 5 sent: Negative  Chromogranin: Negative  CD45: Negative  S-100: Negative  Smooth muscle actin: Positive  Vimentin: Positive  Desmin: Negative  CD34: Negative  Overall, the  immunostaining pattern is nonspecific. Although the neoplasm appears epithelioid, cytokeratin AE1/AE3 staining appears  nonspecific and the only convincingly positive immunostains are smooth muscle actin and vimentin. PSA is equivocal with weak cytoplasmic reactivity, but may be positive; PSAP negative. Metastatic prostate carcinoma cannot be entirely excluded, the sample is limited and precludes definitive diagnosis.  TTF-1-1 can be negative and up to 25% of lung adenocarcinoma, but the combination of negative TTF-1-1 and negative cytokeratin 7 mixed a primary lung adenocarcinoma less likely. Additional material is recommended to further characterize the neoplasm. Please see concurrent specimen reports [id number]; [id number]; [id number].    ASSESSMENT / PLAN:   1. Stage IV hormone-refractory metastatic prostate cancer. Patient had recent rise in PSA upto 61.2 from 38. Bone Scan showed findings consistent with widespread bony metastatic disease especially in the spine, pelvis, ribs, femoral shafts and in the right humerus with no significant change. CT abdomen/pelvis also showed some left sided asymmetry in prostate but not radically changed. He recently has completed radium treatment (Xofigo) by Dr. Baruch Gouty. Serum PSA continues to rise -   Reviewed labs. Have again  explained to patient that metastatic prostate cancer seems to be rapidly progressive, he is agreeable to continue on chemotherapy with palliative Taxotere treatment. Will proceed with cycle 2 chemotherapy with taxotere on 9/7. Monitor weekly labs and f/u at 3 weeks to plan continued treatment. 2. Lung cancer versus lung metastasis from prostate cancer (bronchoscopy/biopsy done on 11/22/14 final pathology report after second opinion from [facility] is Epithelioid Neoplasm with immunostaining pattern is nonspecific,? Metastatic prostate cancer versus lung cancer). If he has lung cancer, then it is at least cT3 cN1 (7 mm left hilar node on CT scan) cMx  (given widespread bone metastasis which could be from prostate primary or lung primary or both) -  continues to have significant cough. Getting XRT.  3. Anemia, thrombocytopenia - patient is asymptomatic. Blood counts have partially improved after coming off of alcohol, patient advised to completely avoid taking alcohol. Continue to monitor. 4. Vertigo - MRI brain 8/18 is negative for brain metastasis, continue Meclizine 25 mg 3 times a day prn for vertigo.  5. Bone-related therapy - also continue on Xgeva 120 mg SQ every 4 weeks for widespread bone metastasis. 6. In between visits, patient advised to call or come to ER in case of any worsening symptoms or sickness. Patient agreeable to this plan.   Leia Alf, MD   01/18/2015 8:13 PM

## 2015-01-19 LAB — PSA: PSA: 801 ng/mL — ABNORMAL HIGH (ref 0.00–4.00)

## 2015-01-26 ENCOUNTER — Inpatient Hospital Stay: Payer: Medicare Other | Admitting: Internal Medicine

## 2015-01-26 ENCOUNTER — Ambulatory Visit: Payer: Medicare Other

## 2015-01-26 ENCOUNTER — Inpatient Hospital Stay: Payer: Medicare Other

## 2015-01-26 ENCOUNTER — Encounter: Payer: Self-pay | Admitting: Internal Medicine

## 2015-01-26 VITALS — BP 141/62 | HR 88 | Temp 98.0°F | Resp 20 | Ht 69.0 in | Wt 157.1 lb

## 2015-01-26 DIAGNOSIS — C61 Malignant neoplasm of prostate: Secondary | ICD-10-CM

## 2015-01-26 DIAGNOSIS — Z23 Encounter for immunization: Secondary | ICD-10-CM

## 2015-01-26 DIAGNOSIS — C7951 Secondary malignant neoplasm of bone: Principal | ICD-10-CM

## 2015-01-26 LAB — CBC WITH DIFFERENTIAL/PLATELET
Basophils Absolute: 0 10*3/uL (ref 0–0.1)
Eosinophils Absolute: 0 10*3/uL (ref 0–0.7)
Eosinophils Relative: 0 %
HEMATOCRIT: 22.1 % — AB (ref 40.0–52.0)
HEMOGLOBIN: 7.2 g/dL — AB (ref 13.0–18.0)
LYMPHS ABS: 0.5 10*3/uL — AB (ref 1.0–3.6)
Lymphocytes Relative: 10 %
MCH: 31.9 pg (ref 26.0–34.0)
MCHC: 32.4 g/dL (ref 32.0–36.0)
MCV: 98.3 fL (ref 80.0–100.0)
MONO ABS: 0.2 10*3/uL (ref 0.2–1.0)
NEUTROS ABS: 4.9 10*3/uL (ref 1.4–6.5)
Neutrophils Relative %: 87 %
Platelets: 79 10*3/uL — ABNORMAL LOW (ref 150–440)
RBC: 2.25 MIL/uL — ABNORMAL LOW (ref 4.40–5.90)
RDW: 20.5 % — AB (ref 11.5–14.5)
WBC: 5.6 10*3/uL (ref 3.8–10.6)

## 2015-01-26 LAB — POTASSIUM: Potassium: 3.5 mmol/L (ref 3.5–5.1)

## 2015-01-26 LAB — SAMPLE TO BLOOD BANK

## 2015-01-26 LAB — CREATININE, SERUM
Creatinine, Ser: 0.66 mg/dL (ref 0.61–1.24)
GFR calc Af Amer: >60 mL/min (ref >60)

## 2015-01-26 MED ORDER — INFLUENZA VAC SPLIT QUAD 0.5 ML IM SUSY: 0.5000 mL | PREFILLED_SYRINGE | Freq: Once | INTRAMUSCULAR | Status: AC

## 2015-01-26 MED ORDER — INFLUENZA VAC SPLIT QUAD 0.5 ML IM SUSY
0.5000 mL | PREFILLED_SYRINGE | Freq: Once | INTRAMUSCULAR | Status: AC
  Administered 2015-01-26: 0.5 mL via INTRAMUSCULAR

## 2015-01-26 MED ORDER — LEUPROLIDE ACETATE (3 MONTH) 22.5 MG IM KIT: 22.5000 mg | PACK | Freq: Once | INTRAMUSCULAR | Status: DC

## 2015-01-26 MED ORDER — SUCRALFATE 1 GM/10ML PO SUSP: 1.0000 g | Freq: Three times a day (TID) | ORAL | Status: AC

## 2015-01-26 MED ORDER — LEUPROLIDE ACETATE (4 MONTH) 30 MG IM KIT
30.0000 mg | PACK | Freq: Once | INTRAMUSCULAR | Status: AC
  Administered 2015-01-26: 30 mg via INTRAMUSCULAR
  Filled 2015-01-26: qty 30

## 2015-01-26 MED ORDER — DENOSUMAB 120 MG/1.7ML ~~LOC~~ SOLN
120.0000 mg | Freq: Once | SUBCUTANEOUS | Status: AC
Start: 2015-01-26 — End: 2015-01-26
  Administered 2015-01-26: 120 mg via SUBCUTANEOUS
  Filled 2015-01-26: qty 1.7

## 2015-01-28 NOTE — Progress Notes (Signed)
[place]  [phone] [phone]     ID: Dustin Frazier OB: 04-Nov-1940  MR#: [id number]  FIE#:[id number]  Patient Care Team: [name], MD as PCP - General (Family Medicine)  CHIEF COMPLAINT/DIAGNOSIS:  1. 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 Nov 2011 -  PSA up to 208. Repeat bone scan showed progression of bone metastasis with new left rib lesions and worsening vertebral disease. Started oral Zytiga and Prednisone December 2011. 04/14/12 Bone Scan. IMPRESSION:  Findings consistent with widespread bony metastatic disease especially in the spine, pelvis, ribs, femoral shafts and in the right humerus. New abnormal localization is seen within the left mandible raising possibility of necrosis of jaw. Evaluated at [facility] oral surgery and felt not to have osteonecrosis.  Tried Xtandi in 2014. 03/23/13 - serum PSA upto 61.2, also found to have progressive disease.  Pathology Report of the left Mandible Curettage done at [facility] on 04/13/13 - acute osteomyelitis. Zytiga stopped early 2015. Started on Xofigo ([facility]) by Dr.Chrystal Feb 2015, completed 6 doses. Continues on Lupron therapy and is on Xgeva for Bone-related therapy. Stared palliative Taxotere chemotherapy on 12/13/14.  2. Lung cancer versus lung metastasis from prostate cancer (bronchoscopy/biopsy done on 11/22/14 final pathology report after second opinion from [facility] is Epithelioid Neoplasm with immunostaining pattern is nonspecific,? Metastatic prostate cancer versus lung cancer). If he has lung cancer, then it is at least cT3 cN1 (7 mm left hilar node on CT scan) cMx (given widespread bone metastasis which could be from prostate primary or lung primary or both). Getting XRT.   HISTORY OF PRESENT ILLNESS:  Patient returns for continued oncology follow-up and plan  next dose of Taxotere chemotherapy. States that he continues to have easy fatigability. Cough and dyspnea on exertion persists but overall better, states that he does bring up a lot of phlegm since getting radiation. States vertigo and imbalance intermittently is better, he has some bad days. Denies any fever or chills. Denies any falls or loss of consciousness. No nausea or vomiting. No urinary symptoms, no hematuria. Tries to remain physically active, states that he does want to continue on chemotherapy.   REVIEW OF SYSTEMS:   ROS As in HPI above. In addition, no fevers. No new headaches or focal weakness.  No new mood disturbances. No angina or palpitation. No abdominal pain, constipation, diarrhea, dysuria or hematuria. No new skin rash or bleeding symptoms. No new paresthesias in extremities. No polyuria polydipsia. PS ECOG 1.  PAST MEDICAL HISTORY: Reviewed. Past Medical History  Diagnosis Date  . 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 Pneumonia   . Arthritis   . 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  Bone scan 2008 also showed metastatic disease, patient has been on Lupron injections and getting Zometa infusions at urologist office.  Per records sent by Dr. Jacqlyn Larsen, PSA had shown response to Casodex treatment and had dropped from 13-9, subsequently in December of 2010 PSA went back up to 16.  Repeat PSA on July 13, 2009 Had increased to 35.9.  Pathology Report of the left Mandible Curettage done at [facility] on 04/13/13 - acute osteomyelitis.  PAST SURGICAL HISTORY: Reviewed. As above  FAMILY HISTORY: Reviewed. Noncontributory, denies malignancy.  SOCIAL HISTORY:  Reviewed. Chronic smoker 30-pack-year history, does take alcohol on a regular basis mostly beer.  Denies recreational drug usage.  No Known Allergies  Current Outpatient Prescriptions  Medication Sig Dispense Refill  . calcium  gluconate 500 MG tablet Take 1 tablet by mouth 3 (three) times daily.    . chlorhexidine (PERIDEX) 0.12 % solution 1 mL by Mouth Rinse route at bedtime.    Marland Kitchen dexamethasone (DECADRON) 4 MG tablet Take 2 tablets twice a day the day before chemo, the day of chemo and day after chemo with each treatment 72 tablet 0  . doxazosin (CARDURA) 2 MG tablet Take 2 mg by mouth at bedtime.     . ferrous sulfate 325 (65 FE) MG tablet Take 325 mg by mouth 2 (two) times daily with a meal.    . leuprolide (LUPRON) 11.25 MG KIT injection Inject 1 each into the muscle every 3 (three) months.    . meclizine (ANTIVERT) 25 MG tablet Take 1 tablet (25 mg total) by mouth 3 (three) times daily as needed for dizziness. 60 tablet 1  . Multiple Vitamin (MULTI-VITAMINS) TABS Take 1 tablet by mouth daily.    . naproxen (NAPROSYN) 500 MG tablet Take 500 mg by mouth 2 (two) times daily with a meal.    . OxyCODONE HCl, Abuse Deter, (OXAYDO) 5 MG TABA Take 1 tablet by mouth every 4 (four) hours as needed.    . predniSONE (DELTASONE) 5 MG tablet Take 1 tablet (5 mg total) by mouth 2 (two) times daily. 60 tablet 3  . sucralfate (CARAFATE) 1 GM/10ML suspension Take 10 mLs (1 g total) by mouth 4 (four) times daily -  with meals and at bedtime. 420 mL 1   Current Facility-Administered Medications  Medication Dose Route Frequency Provider Last Rate Last Dose  . Influenza vac split quadrivalent PF (FLUARIX) injection 0.5 mL  0.5 mL Intramuscular Once Leia Alf, MD        PHYSICAL EXAM:  ECOG FS:1 - Symptomatic but completely ambulatory GENERAL: Patient is chronically weak-looking, otherwise alert and oriented and in no acute distress. No icterus. HEENT: EOMs intact. Oral exam negative for thrush or lesions, mouth is moist.   CVS: S1S2, regular LUNGS: Bilaterally good air entry, occasional rhonchi. ABDOMEN: Soft, nontender. NEURO: Cranial nerves intact, grossly nonfocal.  EXTREMITIES: No pedal edema.   LAB RESULTS:      Component Value Date/Time   NA 140 01/18/2015 0949   NA 140 10/06/2013 0949   K 3.5 01/26/2015 0909   K 3.7 10/06/2013 0949   CL 108 01/18/2015 0949   CL 103 10/06/2013 0949   CO2 25 01/18/2015 0949   CO2 29 10/06/2013 0949   GLUCOSE 93 01/18/2015 0949   GLUCOSE 116* 10/06/2013 0949   BUN 16 01/18/2015 0949   BUN 13 10/06/2013 0949   CREATININE 0.66 01/26/2015 0909   CREATININE 0.83 04/07/2014 0929   CALCIUM 9.3 01/18/2015 0949   CALCIUM 9.2 04/07/2014 0929   PROT 5.9* 01/18/2015 0949   PROT 6.6 04/07/2014 0929   ALBUMIN 2.9* 01/18/2015 0949   ALBUMIN 3.5 04/07/2014 0929   AST 22 01/18/2015 0949   AST 17 04/07/2014 0929   ALT 15* 01/18/2015 0949   ALT 25 04/07/2014 0929   ALKPHOS 99 01/18/2015 0949   ALKPHOS 99 04/07/2014 0929   BILITOT 0.3 01/18/2015 0949   BILITOT 0.4 04/07/2014 0929   GFRNONAA >60 01/26/2015 0909   GFRNONAA >60 04/07/2014 0929   GFRNONAA >60 01/13/2014 6387  GFRAA >60 01/26/2015 0909   GFRAA >60 04/07/2014 0929   GFRAA >60 01/13/2014 0931   Lab Results  Component Value Date   WBC 5.6 01/26/2015   NEUTROABS 4.9 01/26/2015   HGB 7.2* 01/26/2015   HCT 22.1* 01/26/2015   MCV 98.3 01/26/2015   PLT 79* 01/26/2015     STUDIES: 01/18/15 - serum PSA is 801 (was 935 on 11/03/14, 405.2 on 04/07/14, 318.6 on 01/13/14, was 148.9 on 10/21/13, 75.6 on 06/17/13, 59.7 on 05/06/13, 61.2 on 03/23/13, 38 on 01/28/13, 25.2 on 11/12/12).  02/03/13. Bone scan. IMPRESSION:  Numerous foci of increased radiotracer uptake throughout the axial and appendicular skeleton most concerning for metastatic disease similar in appearance to the prior examination.  01/06/14 - Bone Scan. IMPRESSION:  Extensive osseous metastatic disease with overall progression from the prior study.  11/03/14 - CXR. IMPRESSION:  1. Emphysema. No active lung disease.  2. Widespread blastic bone metastases, which have progressed since the chest x-ray of 08/10/2009.  11/09/14 - CT abdomen/pelvis.  IMPRESSION:  1. Widespread metastatic disease to the bones redemonstrated. This appears very similar to the prior examination. Continued enlargement of large right adrenal mass remains highly concerning for adrenal metastasis. 2. Today's study demonstrates 2 large pulmonary nodules in the left upper lobe which have an aggressive appearance. While it is conceivable that one or both of these areas of apparent nodularity could be resolving areas of post infectious/inflammatory scarring, neoplasm is more strongly favored. The appearance is suspicious in particular for primary bronchogenic neoplasm, particularly with regard to the larger of these 2 nodules (image 30 of series 2). Metastatic disease is not excluded, but is not favored on the basis of the appearance on today's examination. Correlation with biopsy could be considered if clinically appropriate. 3. Circumferential thickening of the urinary bladder on today's examination. This could be treatment related, or secondary to urinary tract infection. Clinical correlation is recommended. 4. Cholelithiasis without evidence of acute cholecystitis at this time. 5. Atherosclerosis, including left main and 3 vessel coronary artery disease. Assessment for potential risk factor modification, dietary therapy or pharmacologic therapy may be warranted, if clinically indicated. 6. Additional incidental findings, as above.   11/09/14 - CT Chest. IMPRESSION:  1. Widespread metastatic disease to the bones redemonstrated. This appears very similar to the prior examination. Continued enlargement of large right adrenal mass remains highly concerning for adrenal metastasis. 2. Today's study demonstrates 2 large pulmonary nodules in the left upper lobe which have an aggressive appearance. While it is conceivable that one or both of these areas of apparent nodularity could be resolving areas of post infectious/inflammatory scarring, neoplasm is more strongly favored. The appearance  is suspicious in particular for primary bronchogenic neoplasm, particularly with regard to the larger of these 2 nodules (image 30 of series 2). Metastatic disease is not excluded, but is not favored on the basis of the appearance on today's examination. Correlation with biopsy could be considered if clinically appropriate. 3. Circumferential thickening of the urinary bladder on today's examination. This could be treatment related, or secondary to urinary tract infection. Clinical correlation is recommended. 4. Cholelithiasis without evidence of acute cholecystitis at this time. 5. Atherosclerosis, including left main and 3 vessel coronary artery disease. Assessment for potential risk factor modification, dietary therapy or pharmacologic therapy may be warranted, if clinically indicated. 6. Additional incidental findings, as above.  11/22/14 - Bronchoscopy/Biopsy Pathology Report -   DIAGNOSIS:  A. LUNG NODULE, LEFT UPPER LOBE; FORCEPS BIOPSY:  - EPITHELIOID NEOPLASM (SEE  COMMENT).  Comment:  Due to the challenging nature of this case an external cytopathology  consultation was obtained from [facility]. The touch preparations of the biopsy  show scattered clusters of atypical epithelioid cells with large nuclei,  prominent nucleoli, and moderate to abundant cytoplasm. The morphology is identical to that seen in the biopsy. The biopsy demonstrates a proliferation of epithelioid cells within fibrotic stroma lined by benign bronchial cells. The cells appear to be forming acini/glandular structures. No necrosis is seen and mitotic figures are not prominent.  Immunohistochemical stains were performed at [facility] with the following results:  Cytokeratin AE1/AE3: Focal, weak, cytoplasmic reactivity, may be  nonspecific  Cytokeratin 7: Negative  Cytokeratin 20: Negative  TT F-1: Negative  p40: Negative  PSA: Equivocal, weak cytoplasmic staining in a moderate number of cells  PSAP: Negative  Synoptic 5 sent: Negative    Chromogranin: Negative  CD45: Negative  S-100: Negative  Smooth muscle actin: Positive  Vimentin: Positive  Desmin: Negative  CD34: Negative  Overall, the immunostaining pattern is nonspecific. Although the neoplasm appears epithelioid, cytokeratin AE1/AE3 staining appears  nonspecific and the only convincingly positive immunostains are smooth muscle actin and vimentin. PSA is equivocal with weak cytoplasmic reactivity, but may be positive; PSAP negative. Metastatic prostate carcinoma cannot be entirely excluded, the sample is limited and precludes definitive diagnosis.  TTF-1-1 can be negative and up to 25% of lung adenocarcinoma, but the combination of negative TTF-1-1 and negative cytokeratin 7 mixed a primary lung adenocarcinoma less likely. Additional material is recommended to further characterize the neoplasm. Please see concurrent specimen reports [id number]; [id number]; [id number].    ASSESSMENT / PLAN:   1. Stage IV hormone-refractory metastatic prostate cancer. Patient had recent rise in PSA upto 61.2 from 38. Bone Scan showed findings consistent with widespread bony metastatic disease especially in the spine, pelvis, ribs, femoral shafts and in the right humerus with no significant change. CT abdomen/pelvis also showed some left sided asymmetry in prostate but not radically changed. He recently has completed radium treatment Trudi Ida) by Dr. Baruch Gouty. Serum PSA continues to rise -   have reviewed labs including recent PSA which is showing mild drop since starting chemotherapy. Patient has significant fatigability but remains ambulatory and wants to continue on treatment. He is anemic, otherwise blood counts are adequate, we will proceed with cycle 3  palliative Taxotere treatment. Monitor weekly labs, get CT scan of the chest/abdomen/pelvis on October 27 to assess response to treatment for both metastatic prostate cancer and lung cancer. Next MD follow-up on 02/28/15 with labs and to plan  continued treatment. 2. Lung cancer versus lung metastasis from prostate cancer (bronchoscopy/biopsy done on 11/22/14 final pathology report after second opinion from [facility] is Epithelioid Neoplasm with immunostaining pattern is nonspecific,? Metastatic prostate cancer versus lung cancer). If he has lung cancer, then it is at least cT3 cN1 (7 mm left hilar node on CT scan) cMx (given widespread bone metastasis which could be from prostate primary or lung primary or both) -  patient has completed a course of radiation. He continues on Taxotere chemotherapy. CT scan is planned on October 27 prior to next cycle of chemotherapy.  3. Anemia, thrombocytopenia - hemoglobin 7.2 today, patient states that he is doing steady and wants to hold off on blood transfusion. Continue to monitor and transfuse if hemoglobin less than 7 or progressive anemia symptoms.  4. Vertigo - MRI brain 8/18 is negative for brain metastasis, continue Meclizine 25 mg 3 times a day prn for  vertigo.  5. Bone-related therapy -  continue on Xgeva 120 mg SQ every 4 weeks for widespread bone metastasis. 6. In between visits, patient advised to call or come to ER in case of any worsening symptoms or sickness. Patient agreeable to this plan.   Leia Alf, MD   01/28/2015 12:03 PM

## 2015-01-30 ENCOUNTER — Ambulatory Visit: Payer: Medicare Other | Attending: Pain Medicine | Admitting: Pain Medicine

## 2015-01-30 ENCOUNTER — Encounter: Payer: Self-pay | Admitting: Pain Medicine

## 2015-01-30 VITALS — BP 127/64 | HR 66 | Temp 98.2°F | Resp 18 | Ht 70.0 in | Wt 160.0 lb

## 2015-01-30 DIAGNOSIS — G894 Chronic pain syndrome: Secondary | ICD-10-CM | POA: Diagnosis not present

## 2015-01-30 DIAGNOSIS — M791 Myalgia: Secondary | ICD-10-CM

## 2015-01-30 DIAGNOSIS — G8929 Other chronic pain: Secondary | ICD-10-CM

## 2015-01-30 DIAGNOSIS — M7918 Myalgia, other site: Secondary | ICD-10-CM

## 2015-01-30 DIAGNOSIS — M545 Low back pain, unspecified: Secondary | ICD-10-CM

## 2015-01-30 DIAGNOSIS — M25561 Pain in right knee: Secondary | ICD-10-CM

## 2015-01-30 DIAGNOSIS — M546 Pain in thoracic spine: Secondary | ICD-10-CM

## 2015-01-30 DIAGNOSIS — C61 Malignant neoplasm of prostate: Secondary | ICD-10-CM | POA: Diagnosis not present

## 2015-01-30 DIAGNOSIS — G893 Neoplasm related pain (acute) (chronic): Secondary | ICD-10-CM

## 2015-01-30 DIAGNOSIS — C7951 Secondary malignant neoplasm of bone: Secondary | ICD-10-CM | POA: Diagnosis not present

## 2015-01-30 DIAGNOSIS — M47816 Spondylosis without myelopathy or radiculopathy, lumbar region: Secondary | ICD-10-CM | POA: Diagnosis not present

## 2015-01-30 DIAGNOSIS — M5136 Other intervertebral disc degeneration, lumbar region: Secondary | ICD-10-CM | POA: Diagnosis not present

## 2015-01-30 DIAGNOSIS — M25552 Pain in left hip: Secondary | ICD-10-CM | POA: Diagnosis present

## 2015-01-30 DIAGNOSIS — M25551 Pain in right hip: Secondary | ICD-10-CM | POA: Diagnosis present

## 2015-01-30 DIAGNOSIS — M1711 Unilateral primary osteoarthritis, right knee: Secondary | ICD-10-CM | POA: Diagnosis not present

## 2015-01-30 DIAGNOSIS — M12861 Other specific arthropathies, not elsewhere classified, right knee: Secondary | ICD-10-CM

## 2015-01-30 DIAGNOSIS — I70402 Unspecified atherosclerosis of autologous vein bypass graft(s) of the extremities, left leg: Secondary | ICD-10-CM

## 2015-01-30 DIAGNOSIS — M549 Dorsalgia, unspecified: Secondary | ICD-10-CM | POA: Diagnosis present

## 2015-01-30 DIAGNOSIS — M5417 Radiculopathy, lumbosacral region: Secondary | ICD-10-CM | POA: Insufficient documentation

## 2015-01-30 DIAGNOSIS — M179 Osteoarthritis of knee, unspecified: Secondary | ICD-10-CM

## 2015-01-30 MED ORDER — OXYCODONE HCL 5 MG PO TABS: 5.0000 mg | ORAL_TABLET | Freq: Every day | ORAL | Status: DC | PRN

## 2015-01-30 NOTE — Progress Notes (Signed)
Safety precautions to be maintained throughout the outpatient stay will include: orient to surroundings, keep bed in low position, maintain call bell within reach at all times, provide assistance with transfer out of bed and ambulation. Pill count 0.  Ran out of oxycodone 4 days ago

## 2015-01-31 DIAGNOSIS — M5417 Radiculopathy, lumbosacral region: Secondary | ICD-10-CM | POA: Insufficient documentation

## 2015-01-31 DIAGNOSIS — G8929 Other chronic pain: Secondary | ICD-10-CM | POA: Insufficient documentation

## 2015-01-31 DIAGNOSIS — M545 Low back pain, unspecified: Secondary | ICD-10-CM | POA: Insufficient documentation

## 2015-01-31 DIAGNOSIS — M1711 Unilateral primary osteoarthritis, right knee: Secondary | ICD-10-CM | POA: Insufficient documentation

## 2015-01-31 DIAGNOSIS — M7918 Myalgia, other site: Secondary | ICD-10-CM | POA: Insufficient documentation

## 2015-01-31 DIAGNOSIS — G894 Chronic pain syndrome: Secondary | ICD-10-CM | POA: Insufficient documentation

## 2015-01-31 DIAGNOSIS — G893 Neoplasm related pain (acute) (chronic): Secondary | ICD-10-CM | POA: Insufficient documentation

## 2015-01-31 DIAGNOSIS — M1990 Unspecified osteoarthritis, unspecified site: Secondary | ICD-10-CM | POA: Insufficient documentation

## 2015-01-31 DIAGNOSIS — I70402 Unspecified atherosclerosis of autologous vein bypass graft(s) of the extremities, left leg: Secondary | ICD-10-CM | POA: Insufficient documentation

## 2015-01-31 NOTE — Progress Notes (Signed)
Patient's Name: Dustin Frazier MRN: [id number] DOB: 03-21-1941 DOS: 01/30/2015 Primary Care Physician: [name], MD Location: [facility] Outpatient Pain Management Facility Note by: Kathlen Brunswick. Dossie Arbour, M.D, DABA, DABAPM, DABPM, DABIPP, FIPP  Primary Reason(s) for Visit: Encounter for Medication Management. CC: Back Pain and Hip Pain  HPI:   Dustin Frazier is a 74 y.o. year old, male patient, seen today for evaluation and possible adjustment of analgesic pharmacological regimen.The Patient is scheduled to return today for evaluation of analgesic regimen and possible medication adjustment(s). The patient complains primarily of Back Pain and Hip Pain .   Symptoms: Patient confirms worsening when therapy is absent or decreased. Pharmacotherapy Review: Side-effects or Adverse reactions: None reported. Effectiveness: Described as relatively effective, allowing for increase in activities of daily living (ADL). Onset of action: Within normal limits for current pharmacological therapy. Duration: Within normal limits for medication. Peak effect: Timing and results are as within normal expected parameters.  PMP: Compliant with practice rules and regulations. DST: Compliant with practice rules and regulations. Lab work: No new labs ordered by our practice. Treatment compliance: Compliant. Substance Use Disorder (SUD) Risk Level: Low Planned course of action: Continue therapy as is. Allergies: No Known Allergies Meds: Outpatient Encounter Prescriptions as of 01/30/2015  Medication Sig  . calcium gluconate 500 MG tablet Take 1 tablet by mouth 3 (three) times daily.  . chlorhexidine (PERIDEX) 0.12 % solution 1 mL by Mouth Rinse route at bedtime.  Marland Kitchen dexamethasone (DECADRON) 4 MG tablet Take 2 tablets twice a day the day before chemo, the day of chemo and day after chemo with each treatment  . doxazosin (CARDURA) 2 MG tablet Take 2 mg by mouth at bedtime.   . ferrous sulfate 325 (65 FE) MG tablet Take  325 mg by mouth 2 (two) times daily with a meal.  . leuprolide (LUPRON) 11.25 MG KIT injection Inject 1 each into the muscle every 3 (three) months.  . meclizine (ANTIVERT) 25 MG tablet Take 1 tablet (25 mg total) by mouth 3 (three) times daily as needed for dizziness.  . Multiple Vitamin (MULTI-VITAMINS) TABS Take 1 tablet by mouth daily.  . naproxen (NAPROSYN) 500 MG tablet Take 500 mg by mouth 2 (two) times daily with a meal.  . predniSONE (DELTASONE) 5 MG tablet Take 1 tablet (5 mg total) by mouth 2 (two) times daily.  . sucralfate (CARAFATE) 1 GM/10ML suspension Take 10 mLs (1 g total) by mouth 4 (four) times daily -  with meals and at bedtime.  . [DISCONTINUED] OxyCODONE HCl, Abuse Deter, (OXAYDO) 5 MG TABA Take 1 tablet by mouth every 4 (four) hours as needed.  Marland Kitchen oxyCODONE (OXY IR/ROXICODONE) 5 MG immediate release tablet Take 1 tablet (5 mg total) by mouth 5 (five) times daily as needed for severe pain.   Facility-Administered Encounter Medications as of 01/30/2015  Medication  . Influenza vac split quadrivalent PF (FLUARIX) injection 0.5 mL   Requested Prescriptions   Signed Prescriptions Disp Refills  . oxyCODONE (OXY IR/ROXICODONE) 5 MG immediate release tablet 150 tablet 0    Sig: Take 1 tablet (5 mg total) by mouth 5 (five) times daily as needed for severe pain.   ROS: Constitutional: positive for weight loss Gastrointestinal: negative Musculoskeletal:negative Neurological: negative Behavioral/Psych: negative PFSH: Medical:  Dustin Frazier  has a past medical history of Prostate cancer metastatic to bone [facility]) (04/2006); Pneumonia; Arthritis; and Anemia. Family:  Family History  Problem Relation Age of Onset  . Heart attack Father 65  death  . Diabetes Sister   . COPD Sister   . Emphysema Sister   . Heart Problems Sister   . Diabetes Brother   . Heart Problems Brother   . Liver cancer Brother    Surgical:  Past Surgical History  Procedure Laterality Date  . Back  surgery    . Cervical disc surgery    . Kidney stone    . Nasal septum surgery    . Endobronchial ultrasound N/A 11/22/2014    Procedure: ENDOBRONCHIAL ULTRASOUND;  Surgeon: Vilinda Boehringer, MD;  Location: [facility] ORS;  Service: Cardiopulmonary;  Laterality: N/A;    Social:  Social History  Substance Use Topics  . Smoking status: Former Smoker -- 1.00 packs/day for 30 years    Types: Cigarettes  . Smokeless tobacco: Never Used  . Alcohol Use: 0.0 oz/week    0 Standard drinks or equivalent per week     Comment: occasional beer   Tobacco:  History  Smoking status  . Former Smoker -- 1.00 packs/day for 30 years  . Types: Cigarettes  Smokeless tobacco  . Never Used   Alcohol:  History  Alcohol Use  . 0.0 oz/week  . 0 Standard drinks or equivalent per week    Comment: occasional beer   Drug:  History  Drug Use No   Physical Exam: Vitals:  Today's Vitals   01/30/15 0939 01/30/15 0942  BP: 127/64   Pulse: 66   Temp: 98.2 F (36.8 C)   Resp: 18   Height: 5' 10"  (1.778 m)   Weight: 160 lb (72.576 kg)   SpO2: 99%   PainSc: 3  3   PainLoc: Back    Calculated BMI: Body mass index is 22.96 kg/(m^2). Exam:  General appearance: alert, cooperative, appears older than stated age, cachectic, no distress and pale Eyes: conjunctivae/corneas clear. PERRL, EOM's intact. Fundi benign. Neck: no adenopathy, no carotid bruit, no JVD, supple, symmetrical, trachea midline and thyroid not enlarged, symmetric, no tenderness/mass/nodules Back: symmetric, no curvature. ROM normal. No CVA tenderness. Lungs: No respiratory distress. Extremities: extremities normal, atraumatic, no cyanosis or edema Pulses: 2+ and symmetric Skin: Skin color, texture, turgor normal. No rashes or lesions Neurologic: Grossly normal  Assessment: Encounter Diagnosis:  Primary Diagnosis: Chronic pain syndrome [G89.4] Problem List:  Patient Active Problem List   Diagnosis Date Noted  . Chronic pain syndrome  01/31/2015  . Cancer related pain 01/31/2015  . Upper back pain 01/31/2015  . Chronic low back pain 01/31/2015  . Spondylosis of lumbar region without myelopathy or radiculopathy 01/31/2015  . DDD (degenerative disc disease), lumbar 01/31/2015  . Myofascial pain syndrome 01/31/2015  . 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 Lumbosacral radiculopathy 01/31/2015  . Right knee pain 01/31/2015  . Primary osteoarthritis of right knee 01/31/2015  . Arthritis, senescent 01/31/2015  . Arthropathy of right knee 01/31/2015  . Lung mass 11/10/2014  . Pneumonia 11/10/2014  . Prostate cancer metastatic to bone ([place]) 11/03/2014  . 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 Persons encountering health services in other specified circumstances 02/11/2013  . Incomplete bladder emptying 12/27/2011  . CA of prostate ([place]) 12/27/2011  . Bone metastases ([place]) 12/27/2011  . Obstruction of urinary tract 12/27/2011  . Back ache 11/07/2011  . Benign fibroma of prostate 11/07/2011  . BP (high blood pressure) 11/07/2011  . Metastasis from malignant tumor of prostate ([place]) 11/07/2011   Plan: Trusten was seen today for back pain and  hip pain.  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   Patient instructions:  There are no Patient Instructions on file for this visit.  Medications discontinued today:  Medications Discontinued During This Encounter  Medication Reason  . OxyCODONE HCl, Abuse Deter, (OXAYDO) 5 MG TABA Entry Error    Medications administered today:  Mr. Pollio had no medications administered during this visit.  Images attached to encounter:  No images are attached to the encounter.  Scans attached to encounter:  No scans are attached to the encounter.

## 2015-02-01 ENCOUNTER — Other Ambulatory Visit: Payer: Self-pay | Admitting: *Deleted

## 2015-02-01 DIAGNOSIS — C7951 Secondary malignant neoplasm of bone: Principal | ICD-10-CM

## 2015-02-01 DIAGNOSIS — C61 Malignant neoplasm of prostate: Secondary | ICD-10-CM

## 2015-02-02 ENCOUNTER — Inpatient Hospital Stay: Payer: Medicare Other | Attending: Internal Medicine

## 2015-02-02 ENCOUNTER — Inpatient Hospital Stay: Payer: Medicare Other

## 2015-02-02 ENCOUNTER — Other Ambulatory Visit: Payer: Self-pay | Admitting: *Deleted

## 2015-02-02 ENCOUNTER — Other Ambulatory Visit: Payer: Self-pay | Admitting: Internal Medicine

## 2015-02-02 DIAGNOSIS — G629 Polyneuropathy, unspecified: Secondary | ICD-10-CM | POA: Diagnosis not present

## 2015-02-02 DIAGNOSIS — D696 Thrombocytopenia, unspecified: Secondary | ICD-10-CM | POA: Diagnosis not present

## 2015-02-02 DIAGNOSIS — C7951 Secondary malignant neoplasm of bone: Secondary | ICD-10-CM | POA: Insufficient documentation

## 2015-02-02 DIAGNOSIS — Z87891 Personal history of nicotine dependence: Secondary | ICD-10-CM | POA: Insufficient documentation

## 2015-02-02 DIAGNOSIS — Z7689 Persons encountering health services in other specified circumstances: Secondary | ICD-10-CM | POA: Insufficient documentation

## 2015-02-02 DIAGNOSIS — C61 Malignant neoplasm of prostate: Secondary | ICD-10-CM

## 2015-02-02 DIAGNOSIS — Z5111 Encounter for antineoplastic chemotherapy: Secondary | ICD-10-CM | POA: Diagnosis present

## 2015-02-02 DIAGNOSIS — Z79899 Other long term (current) drug therapy: Secondary | ICD-10-CM | POA: Insufficient documentation

## 2015-02-02 DIAGNOSIS — D649 Anemia, unspecified: Secondary | ICD-10-CM | POA: Diagnosis not present

## 2015-02-02 DIAGNOSIS — Z23 Encounter for immunization: Secondary | ICD-10-CM

## 2015-02-02 LAB — CBC WITH DIFFERENTIAL/PLATELET
BASOS ABS: 0.1 10*3/uL (ref 0–0.1)
EOS ABS: 0 10*3/uL (ref 0–0.7)
HEMATOCRIT: 22.9 % — AB (ref 40.0–52.0)
Hemoglobin: 7.4 g/dL — ABNORMAL LOW (ref 13.0–18.0)
Lymphocytes Relative: 8 %
Lymphs Abs: 0.6 10*3/uL — ABNORMAL LOW (ref 1.0–3.6)
MCH: 32.7 pg (ref 26.0–34.0)
MCHC: 32.4 g/dL (ref 32.0–36.0)
MCV: 100.9 fL — ABNORMAL HIGH (ref 80.0–100.0)
MONO ABS: 0.3 10*3/uL (ref 0.2–1.0)
Monocytes Relative: 4 %
NEUTROS ABS: 6.8 10*3/uL — AB (ref 1.4–6.5)
Neutrophils Relative %: 87 %
PLATELETS: 68 10*3/uL — AB (ref 150–440)
RBC: 2.27 MIL/uL — ABNORMAL LOW (ref 4.40–5.90)
RDW: 23.5 % — AB (ref 11.5–14.5)
WBC: 7.8 10*3/uL (ref 3.8–10.6)

## 2015-02-02 LAB — SAMPLE TO BLOOD BANK

## 2015-02-02 NOTE — Progress Notes (Signed)
Platelet count still low at 60K today. Plan is to hold chemo, he will f/u with Dr.Brahmanday on 02/07/15 with repeat CBC/diff and pursue next dose of chemo if hematological recovery is adeqaute.

## 2015-02-06 ENCOUNTER — Encounter: Payer: Self-pay | Admitting: Radiation Oncology

## 2015-02-06 ENCOUNTER — Ambulatory Visit
Admission: RE | Admit: 2015-02-06 | Discharge: 2015-02-06 | Disposition: A | Discharge status: Home / Self Care | Payer: Medicare Other | Source: Ambulatory Visit | Attending: Radiation Oncology | Admitting: Radiation Oncology

## 2015-02-06 VITALS — BP 136/65 | HR 87 | Temp 96.0°F | Resp 20 | Wt 167.1 lb

## 2015-02-06 DIAGNOSIS — C7951 Secondary malignant neoplasm of bone: Secondary | ICD-10-CM

## 2015-02-06 DIAGNOSIS — C61 Malignant neoplasm of prostate: Secondary | ICD-10-CM

## 2015-02-06 DIAGNOSIS — C3492 Malignant neoplasm of unspecified part of left bronchus or lung: Secondary | ICD-10-CM

## 2015-02-06 HISTORY — DX: Dizziness and giddiness: R42

## 2015-02-06 HISTORY — DX: Thrombocytopenia, unspecified: D69.6

## 2015-02-06 NOTE — Progress Notes (Signed)
Radiation Oncology Follow up Note  Name: Dustin Frazier   Date:   02/06/2015 MRN:  [id number] DOB: Jan 31, 1941    This 74 y.o. male presents to the clinic today for follow-up for lung cancer and prostate cancer.  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 He also developed poorly differentiated non-small cell lung cancer in the left upper lobe. We treated him with a hypofractionated course of radiation therapy 6000 cGy in 10 fractions and he is now out 1 month. He is doing fairly well. His chemotherapy for prostate cancer is on hold based on thrombocytopenia. He specifically denies cough hemoptysis or chest tightness. He is scheduled for another CT scan at the end of the month.  COMPLICATIONS OF TREATMENT: none  FOLLOW UP COMPLIANCE: keeps appointments   PHYSICAL EXAM:  BP 136/65 mmHg  Pulse 87  Temp(Src) 96 F (35.6 C)  Resp 20  Wt 167 lb 1.7 oz (75.8 kg) Frail-appearing male in NAD appears slightly anemic. Well-developed well-nourished patient in NAD. HEENT reveals PERLA, EOMI, discs not visualized.  Oral cavity is clear. No oral mucosal lesions are identified. Neck is clear without evidence of cervical or supraclavicular adenopathy. Lungs are clear to A&P. Cardiac examination is essentially unremarkable with regular rate and rhythm without murmur rub or thrill. Abdomen is benign with no organomegaly or masses noted. Motor sensory and DTR levels are equal and symmetric in the upper and lower extremities. Cranial nerves II through XII are grossly intact. Proprioception is intact. No peripheral adenopathy or edema is identified. No motor or sensory levels are noted. Crude visual fields are within normal range.  RADIOLOGY RESULTS: CT scan the end of October will be reviewed  PLAN: At the present time he continues close  follow-up care with medical oncology for his stage IV prostate cancer. Will review his CT scan when it becomes available. He is tolerated treatment to his chest well with good response without significant side effect. He is doing well at this time. I've asked to see him back in 3-4 months for follow-up. He knows to call sooner with any concerns.  I would like to take this opportunity for allowing me to participate in the care of your patient.Armstead Peaks., MD

## 2015-02-07 ENCOUNTER — Inpatient Hospital Stay: Payer: Medicare Other

## 2015-02-07 ENCOUNTER — Other Ambulatory Visit: Payer: Self-pay | Admitting: Internal Medicine

## 2015-02-07 ENCOUNTER — Inpatient Hospital Stay (HOSPITAL_BASED_OUTPATIENT_CLINIC_OR_DEPARTMENT_OTHER): Payer: Medicare Other | Admitting: Internal Medicine

## 2015-02-07 VITALS — BP 159/67 | HR 86 | Temp 96.5°F | Resp 18 | Ht 70.0 in | Wt 164.5 lb

## 2015-02-07 DIAGNOSIS — C61 Malignant neoplasm of prostate: Secondary | ICD-10-CM

## 2015-02-07 DIAGNOSIS — D649 Anemia, unspecified: Secondary | ICD-10-CM | POA: Diagnosis not present

## 2015-02-07 DIAGNOSIS — Z7951 Long term (current) use of inhaled steroids: Secondary | ICD-10-CM | POA: Diagnosis not present

## 2015-02-07 DIAGNOSIS — Z79899 Other long term (current) drug therapy: Secondary | ICD-10-CM

## 2015-02-07 DIAGNOSIS — D630 Anemia in neoplastic disease: Secondary | ICD-10-CM

## 2015-02-07 DIAGNOSIS — D696 Thrombocytopenia, unspecified: Secondary | ICD-10-CM

## 2015-02-07 DIAGNOSIS — G629 Polyneuropathy, unspecified: Secondary | ICD-10-CM

## 2015-02-07 DIAGNOSIS — Z5111 Encounter for antineoplastic chemotherapy: Secondary | ICD-10-CM | POA: Diagnosis not present

## 2015-02-07 DIAGNOSIS — C7951 Secondary malignant neoplasm of bone: Principal | ICD-10-CM

## 2015-02-07 DIAGNOSIS — D510 Vitamin B12 deficiency anemia due to intrinsic factor deficiency: Secondary | ICD-10-CM

## 2015-02-07 LAB — CBC WITH DIFFERENTIAL/PLATELET
Basophils Absolute: 0 10*3/uL (ref 0–0.1)
EOS ABS: 0 10*3/uL (ref 0–0.7)
HCT: 23.1 % — ABNORMAL LOW (ref 40.0–52.0)
Hemoglobin: 7.3 g/dL — ABNORMAL LOW (ref 13.0–18.0)
LYMPHS ABS: 0.4 10*3/uL — AB (ref 1.0–3.6)
Lymphocytes Relative: 6 %
MCH: 32.7 pg (ref 26.0–34.0)
MCHC: 31.9 g/dL — AB (ref 32.0–36.0)
MCV: 102.5 fL — ABNORMAL HIGH (ref 80.0–100.0)
MONO ABS: 0.3 10*3/uL (ref 0.2–1.0)
Neutro Abs: 5.9 10*3/uL (ref 1.4–6.5)
Neutrophils Relative %: 89 %
Platelets: 79 10*3/uL — ABNORMAL LOW (ref 150–440)
RBC: 2.25 MIL/uL — ABNORMAL LOW (ref 4.40–5.90)
RDW: 23.6 % — AB (ref 11.5–14.5)
WBC: 6.7 10*3/uL (ref 3.8–10.6)

## 2015-02-07 LAB — SAMPLE TO BLOOD BANK

## 2015-02-07 LAB — PREPARE RBC (CROSSMATCH)

## 2015-02-07 NOTE — Progress Notes (Signed)
Dustin Frazier OFFICE PROGRESS NOTE  Patient Care Team: [name], MD as PCP - General (Family Medicine)   SUMMARY OF ONCOLOGIC HISTORY:  # 2011- CASTRATE RESISTANT PROSTATE CA STAGE IV [bone mets] [2008- Dx Prostate ca Gleason 7]; STARTED DEC 2011-Zytiga + Pred; # X-Tandi; # Trudi Ida; July PSA 935 # Aug 2016- START TAXOTERE    # July 2016- LUL 2-3 cm Hilar lesion x 2- Likely Mets from prostate Ca vs Primary Lung- Bx- Epitheloid [facility]- inconclusive] s/p RT Aug 2016; Dr.Crystal]  INTERVAL HISTORY:  This is my first interaction with the patient since I joined the practice September 2016. I reviewed the patient's prior chart/pertinent labs/imaging in detail; findings are summarized.  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 His status post 2 cycles of chemotherapy. Given ongoing thrombocytopenia and anemia his cycle #3 has been held for the last 2 weeks.  He does complain of significant fatigue which is worse with exertion. Denies any cough. No fevers.  Overall his appetite is improving. He is gaining weight. He denies mild diarrhea with the chemotherapy. He has mild tingling and numbness in his feet. This is not interrupting his daily lifestyle. Mild swelling in the legs; otherwise no nausea vomiting.   REVIEW OF SYSTEMS:  A complete 10 point review of system is done which is negative except mentioned above/history of present illness.   PAST MEDICAL HISTORY :  Past Medical History  Diagnosis Date  . Prostate cancer metastatic to bone ([place]) 04/2006  . Pneumonia   . Arthritis   . Anemia   . Thrombocytopenia ([place])   . Vertigo     PAST SURGICAL HISTORY :   Past Surgical History  Procedure Laterality Date  . Back surgery    . Cervical disc surgery    . Kidney stone    . Nasal septum surgery    .  Endobronchial ultrasound N/A 11/22/2014    Procedure: ENDOBRONCHIAL ULTRASOUND;  Surgeon: Vilinda Boehringer, MD;  Location: [facility] ORS;  Service: Cardiopulmonary;  Laterality: N/A;    FAMILY HISTORY :   Family History  Problem Relation Age of Onset  . Heart attack Father 31    death  . Diabetes Sister   . COPD Sister   . Emphysema Sister   . Heart Problems Sister   . Diabetes Brother   . Heart Problems Brother   . Liver cancer Brother     SOCIAL HISTORY:   Social History  Substance Use Topics  . Smoking status: Former Smoker -- 1.00 packs/day for 30 years    Types: Cigarettes  . Smokeless tobacco: Never Used  . Alcohol Use: 0.0 oz/week    0 Standard drinks or equivalent per week     Comment: occasional beer    ALLERGIES:  has No Known Allergies.  MEDICATIONS:  Current Outpatient Prescriptions  Medication Sig Dispense Refill  . calcium gluconate 500 MG tablet Take 1 tablet by mouth 3 (three) times daily.    . chlorhexidine (PERIDEX) 0.12 % solution 1 mL by Mouth Rinse route at bedtime.    Marland Kitchen dexamethasone (DECADRON) 4 MG tablet Take 2 tablets twice a day the day before chemo, the day of chemo and day after chemo with each treatment 72 tablet 0  . doxazosin (CARDURA) 2 MG tablet Take 2 mg by mouth at bedtime.     Marland Kitchen  ferrous sulfate 325 (65 FE) MG tablet Take 325 mg by mouth 2 (two) times daily with a meal.    . leuprolide (LUPRON) 11.25 MG KIT injection Inject 1 each into the muscle every 3 (three) months.    . meclizine (ANTIVERT) 25 MG tablet Take 1 tablet (25 mg total) by mouth 3 (three) times daily as needed for dizziness. 60 tablet 1  . Multiple Vitamin (MULTI-VITAMINS) TABS Take 1 tablet by mouth daily.    . naproxen (NAPROSYN) 500 MG tablet Take 500 mg by mouth 2 (two) times daily with a meal.    . oxyCODONE (OXY IR/ROXICODONE) 5 MG immediate release tablet Take 1 tablet (5 mg total) by mouth 5 (five) times daily as needed for severe pain. 150 tablet 0  . predniSONE  (DELTASONE) 5 MG tablet Take 1 tablet (5 mg total) by mouth 2 (two) times daily. 60 tablet 3  . sucralfate (CARAFATE) 1 GM/10ML suspension Take 10 mLs (1 g total) by mouth 4 (four) times daily -  with meals and at bedtime. 420 mL 1   No current facility-administered medications for this visit.   Facility-Administered Medications Ordered in Other Visits  Medication Dose Route Frequency Provider Last Rate Last Dose  . Influenza vac split quadrivalent PF (FLUARIX) injection 0.5 mL  0.5 mL Intramuscular Once Leia Alf, MD        PHYSICAL EXAMINATION: ECOG PERFORMANCE STATUS: 1 - Symptomatic but completely ambulatory  BP 159/67 mmHg  Pulse 86  Temp(Src) 96.5 F (35.8 C) (Tympanic)  Resp 18  Ht 5' 10"  (1.778 m)  Wt 164 lb 7.4 oz (74.6 kg)  BMI 23.60 kg/m2  Filed Weights   02/07/15 0958  Weight: 164 lb 7.4 oz (74.6 kg)    GENERAL: Well-nourished well-developed; Alert, no distress and comfortable.   Accompanied by daughter.  EYES: Positive for pallor no icterus OROPHARYNX: no thrush or ulceration; good dentition  NECK: supple, no masses felt LYMPH:  no palpable lymphadenopathy in the cervical, axillary or inguinal regions LUNGS: clear to auscultation and  No wheeze or crackles HEART/CVS: regular rate & rhythm and no murmurs; 1+ bilatera lower extremity edema ABDOMEN:abdomen soft, non-tender and normal bowel sounds Musculoskeletal:no cyanosis of digits and no clubbing  PSYCH: alert & oriented x 3 with fluent speech NEURO: no focal motor/sensory deficits SKIN:  no rashes or significant lesions  LABORATORY DATA:  I have reviewed the data as listed    Component Value Date/Time   NA 140 01/18/2015 0949   NA 140 10/06/2013 0949   K 3.5 01/26/2015 0909   K 3.7 10/06/2013 0949   CL 108 01/18/2015 0949   CL 103 10/06/2013 0949   CO2 25 01/18/2015 0949   CO2 29 10/06/2013 0949   GLUCOSE 93 01/18/2015 0949   GLUCOSE 116* 10/06/2013 0949   BUN 16 01/18/2015 0949   BUN 13  10/06/2013 0949   CREATININE 0.66 01/26/2015 0909   CREATININE 0.83 04/07/2014 0929   CALCIUM 9.3 01/18/2015 0949   CALCIUM 9.2 04/07/2014 0929   PROT 5.9* 01/18/2015 0949   PROT 6.6 04/07/2014 0929   ALBUMIN 2.9* 01/18/2015 0949   ALBUMIN 3.5 04/07/2014 0929   AST 22 01/18/2015 0949   AST 17 04/07/2014 0929   ALT 15* 01/18/2015 0949   ALT 25 04/07/2014 0929   ALKPHOS 99 01/18/2015 0949   ALKPHOS 99 04/07/2014 0929   BILITOT 0.3 01/18/2015 0949   BILITOT 0.4 04/07/2014 0929   GFRNONAA >60 01/26/2015 0909   GFRNONAA >  60 04/07/2014 0929   GFRNONAA >60 01/13/2014 0931   GFRAA >60 01/26/2015 0909   GFRAA >60 04/07/2014 0929   GFRAA >60 01/13/2014 0931    No results found for: SPEP, UPEP  Lab Results  Component Value Date   WBC 6.7 02/07/2015   NEUTROABS 5.9 02/07/2015   HGB 7.3* 02/07/2015   HCT 23.1* 02/07/2015   MCV 102.5* 02/07/2015   PLT 79* 02/07/2015      Chemistry      Component Value Date/Time   NA 140 01/18/2015 0949   NA 140 10/06/2013 0949   K 3.5 01/26/2015 0909   K 3.7 10/06/2013 0949   CL 108 01/18/2015 0949   CL 103 10/06/2013 0949   CO2 25 01/18/2015 0949   CO2 29 10/06/2013 0949   BUN 16 01/18/2015 0949   BUN 13 10/06/2013 0949   CREATININE 0.66 01/26/2015 0909   CREATININE 0.83 04/07/2014 0929      Component Value Date/Time   CALCIUM 9.3 01/18/2015 0949   CALCIUM 9.2 04/07/2014 0929   ALKPHOS 99 01/18/2015 0949   ALKPHOS 99 04/07/2014 0929   AST 22 01/18/2015 0949   AST 17 04/07/2014 0929   ALT 15* 01/18/2015 0949   ALT 25 04/07/2014 0929   BILITOT 0.3 01/18/2015 0949   BILITOT 0.4 04/07/2014 0929       RADIOGRAPHIC STUDIES: I have personally reviewed the radiological images as listed and agreed with the findings in the report. No results found.   ASSESSMENT & PLAN:   # METASTATIC PROSTATE CA -CASTRATE RESISTANT: Currently on fourth line therapy with Lupron q 54M /Taxotere q3W; status post cycle #2. Patient had improvement of the  PSA from 935 in July 2016 to 801 September 2016 just after 2 cycles of chemotherapy. Also is having improved appetite/ weight gain.   Cycle #3 has been held because of ongoing thrombocytopenia. Today platelet count is 79/hemoglobin is 7.8; white count is 6.7. Recommend holding chemotherapy this week.   I would recommend dose reduction by about 20% given the ongoing cytopenias. Appropriate dose reduction has been done in the chemotherapy orders.   I had a long discussion with the patient regarding the rationale for the current therapy; discuss that provenge might not be the ideal drug at this time.   #  Met from Prostate vs LUNG CA  [2016] s/p RT [Aug 2016; Dr.Crystal].    # Mets to Bone: on X-geva SQ q 4W; no concerns for osteonecrosis of the jaw.    # Anemia/ thrombocytopenia- patient is symptomatic anemia recommend 1 unit of PRBC transfusion. Thrombocytopenia platelets 79 asymptomatic. 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    Orders Placed This Encounter  Procedures  . Prepare RBC    Standing Status: Standing     Number of Occurrences: 1     Standing Expiration Date:     Order Specific Question:  # of Units    Answer:  1 unit    Order Specific Question:  Transfusion Indications    Answer:  Symptomatic Anemia    Order Specific Question:  If emergent release call blood bank    Answer:  Not emergent release  . Type and screen To be transfused 02/08/15    To be transfused 02/08/15    Standing Status: Standing     Number of Occurrences: 1     Standing Expiration Date:    All questions were answered. The patient knows to call the  clinic with any problems, questions or concerns. No barriers to learning was detected. I spent 40 minutes counseling the patient face to face. The total time spent in the appointment was 55 minutes and more than 50% was on counseling and review of test results     Cammie Sickle, MD 02/07/2015 1:01  PM

## 2015-02-08 ENCOUNTER — Inpatient Hospital Stay: Payer: Medicare Other

## 2015-02-08 VITALS — BP 143/76 | HR 72 | Temp 96.7°F | Resp 18

## 2015-02-08 DIAGNOSIS — D630 Anemia in neoplastic disease: Secondary | ICD-10-CM

## 2015-02-08 DIAGNOSIS — Z5111 Encounter for antineoplastic chemotherapy: Secondary | ICD-10-CM | POA: Diagnosis not present

## 2015-02-08 MED ORDER — SODIUM CHLORIDE 0.9 % IV SOLN
250.0000 mL | Freq: Once | INTRAVENOUS | Status: AC
  Administered 2015-02-08: 250 mL via INTRAVENOUS
  Filled 2015-02-08: qty 250

## 2015-02-08 NOTE — Progress Notes (Signed)
Spoke to patient regarding the benefits of PRBC transfusion; I also discussed the potential adverse events- including but not limited to infusion reactions and also very small risk of transmission of infections. Patient agrees to proceed with a blood transfusion.

## 2015-02-09 ENCOUNTER — Other Ambulatory Visit: Payer: Medicare Other

## 2015-02-09 LAB — TYPE AND SCREEN
ABO/RH(D): O NEG
ANTIBODY SCREEN: NEGATIVE
Unit division: 0

## 2015-02-14 ENCOUNTER — Inpatient Hospital Stay: Payer: Medicare Other

## 2015-02-14 ENCOUNTER — Telehealth: Payer: Self-pay

## 2015-02-14 ENCOUNTER — Other Ambulatory Visit: Payer: Self-pay | Admitting: *Deleted

## 2015-02-14 ENCOUNTER — Inpatient Hospital Stay (HOSPITAL_BASED_OUTPATIENT_CLINIC_OR_DEPARTMENT_OTHER): Payer: Medicare Other | Admitting: Internal Medicine

## 2015-02-14 VITALS — BP 120/68 | HR 68 | Temp 97.0°F | Resp 18

## 2015-02-14 VITALS — BP 134/73 | HR 76 | Temp 97.8°F | Resp 18 | Ht 70.0 in | Wt 167.0 lb

## 2015-02-14 DIAGNOSIS — Z23 Encounter for immunization: Secondary | ICD-10-CM

## 2015-02-14 DIAGNOSIS — D649 Anemia, unspecified: Secondary | ICD-10-CM | POA: Diagnosis not present

## 2015-02-14 DIAGNOSIS — G629 Polyneuropathy, unspecified: Secondary | ICD-10-CM

## 2015-02-14 DIAGNOSIS — C61 Malignant neoplasm of prostate: Secondary | ICD-10-CM

## 2015-02-14 DIAGNOSIS — Z5111 Encounter for antineoplastic chemotherapy: Secondary | ICD-10-CM | POA: Diagnosis not present

## 2015-02-14 DIAGNOSIS — C7951 Secondary malignant neoplasm of bone: Secondary | ICD-10-CM | POA: Diagnosis not present

## 2015-02-14 DIAGNOSIS — Z79899 Other long term (current) drug therapy: Secondary | ICD-10-CM

## 2015-02-14 DIAGNOSIS — D696 Thrombocytopenia, unspecified: Secondary | ICD-10-CM

## 2015-02-14 DIAGNOSIS — Z418 Encounter for other procedures for purposes other than remedying health state: Secondary | ICD-10-CM

## 2015-02-14 LAB — SAMPLE TO BLOOD BANK

## 2015-02-14 LAB — CBC WITH DIFFERENTIAL/PLATELET
Basophils Absolute: 0 10*3/uL (ref 0–0.1)
Basophils Relative: 0 %
Eosinophils Absolute: 0 10*3/uL (ref 0–0.7)
Eosinophils Relative: 0 %
HEMATOCRIT: 24.2 % — AB (ref 40.0–52.0)
HEMOGLOBIN: 7.9 g/dL — AB (ref 13.0–18.0)
LYMPHS ABS: 0.4 10*3/uL — AB (ref 1.0–3.6)
MCH: 32.1 pg (ref 26.0–34.0)
MCHC: 32.8 g/dL (ref 32.0–36.0)
MCV: 97.8 fL (ref 80.0–100.0)
Monocytes Absolute: 0.2 10*3/uL (ref 0.2–1.0)
NEUTROS ABS: 4.5 10*3/uL (ref 1.4–6.5)
Platelets: 70 10*3/uL — ABNORMAL LOW (ref 150–440)
RBC: 2.47 MIL/uL — AB (ref 4.40–5.90)
RDW: 24.7 % — ABNORMAL HIGH (ref 11.5–14.5)
WBC: 5.2 10*3/uL (ref 3.8–10.6)

## 2015-02-14 MED ORDER — SODIUM CHLORIDE 0.9 % IV SOLN
Freq: Once | INTRAVENOUS | Status: AC
  Administered 2015-02-14: 11:00:00 via INTRAVENOUS
  Filled 2015-02-14: qty 8

## 2015-02-14 MED ORDER — DEXAMETHASONE 4 MG PO TABS: ORAL_TABLET | ORAL | Status: AC

## 2015-02-14 MED ORDER — DOCETAXEL CHEMO INJECTION 160 MG/16ML
60.0000 mg/m2 | Freq: Once | INTRAVENOUS | Status: AC
  Administered 2015-02-14: 120 mg via INTRAVENOUS
  Filled 2015-02-14: qty 12

## 2015-02-14 MED ORDER — SODIUM CHLORIDE 0.9 % IV SOLN
Freq: Once | INTRAVENOUS | Status: AC
  Administered 2015-02-14: 10:00:00 via INTRAVENOUS
  Filled 2015-02-14: qty 1000

## 2015-02-14 MED ORDER — FAMOTIDINE IN NACL 20-0.9 MG/50ML-% IV SOLN
20.0000 mg | Freq: Two times a day (BID) | INTRAVENOUS | Status: DC
  Administered 2015-02-14: 20 mg via INTRAVENOUS
  Filled 2015-02-14: qty 50

## 2015-02-14 MED ORDER — PEGFILGRASTIM 6 MG/0.6ML ~~LOC~~ PSKT
6.0000 mg | PREFILLED_SYRINGE | Freq: Once | SUBCUTANEOUS | Status: AC
  Administered 2015-02-14: 6 mg via SUBCUTANEOUS

## 2015-02-14 MED ORDER — DIPHENHYDRAMINE HCL 50 MG/ML IJ SOLN
25.0000 mg | Freq: Once | INTRAMUSCULAR | Status: AC
  Administered 2015-02-14: 25 mg via INTRAVENOUS
  Filled 2015-02-14: qty 1

## 2015-02-14 NOTE — Patient Instructions (Addendum)
Thrombocytopenia Thrombocytopenia is a condition in which there is an abnormally small number of platelets in your blood. Platelets are also called thrombocytes. Platelets are needed for blood clotting. CAUSES Thrombocytopenia is caused by:   Decreased production of platelets. This can be caused by:  Aplastic anemia in which your bone marrow quits making blood cells.  Cancer in the bone marrow.  Use of certain medicines, including chemotherapy.  Infection in the bone marrow.  Heavy alcohol consumption.  Increased destruction of platelets. This can be caused by:  Certain immune diseases.  Use of certain drugs.  Certain blood clotting disorders.  Certain inherited disorders.  Certain bleeding disorders.  Pregnancy.  Having an enlarged spleen (hypersplenism). In hypersplenism, the spleen gathers up platelets from circulation. This means the platelets are not available to help with blood clotting. The spleen can enlarge due to cirrhosis or other conditions. SYMPTOMS  The symptoms of thrombocytopenia are side effects of poor blood clotting. Some of these are:  Abnormal bleeding.  Nosebleeds.  Heavy menstrual periods.  Blood in the urine or stools.  Purpura. This is a purplish discoloration in the skin produced by small bleeding vessels near the surface of the skin.  Bruising.  A rash that may be petechial. This looks like pinpoint, purplish-red spots on the skin and mucous membranes. It is caused by bleeding from small blood vessels (capillaries). DIAGNOSIS  Your caregiver will make this diagnosis based on your exam and blood tests. Sometimes, a bone marrow study is done to look for the original cells (megakaryocytes) that make platelets. TREATMENT  Treatment depends on the cause of the condition.  Medicines may be given to help protect your platelets from being destroyed.  In some cases, a replacement (transfusion) of platelets may be required to stop or prevent  bleeding.  Sometimes, the spleen must be surgically removed. HOME CARE INSTRUCTIONS   Check the skin and linings inside your mouth for bruising or bleeding as directed by your caregiver.  Check your sputum, urine, and stool for blood as directed by your caregiver.  Do not return to any activities that could cause bumps or bruises until your caregiver says it is okay.  Take extra care not to cut yourself when shaving or when using scissors, needles, knives, and other tools.  Take extra care not to burn yourself when ironing or cooking.  Ask your caregiver if it is okay for you to drink alcohol.  Only take over-the-counter or prescription medicines as directed by your caregiver.  Notify all your caregivers, including dentists and eye doctors, about your condition. SEEK IMMEDIATE MEDICAL CARE IF:   You develop active bleeding from anywhere in your body.  You develop unexplained bruising or bleeding.  You have blood in your sputum, urine, or stool. MAKE SURE YOU:  Understand these instructions.  Will watch your condition.  Will get help right away if you are not doing well or get worse.   This information is not intended to replace advice given to you by your health care provider. Make sure you discuss any questions you have with your health care provider.   Document Released: 04/15/2005 Document Revised: 07/08/2011 Document Reviewed: 10/17/2014 Elsevier Interactive Patient Education 2016 [employer] Pegfilgrastim injection What is this medicine? PEGFILGRASTIM (PEG fil gra stim) is a long-acting granulocyte colony-stimulating factor that stimulates the growth of neutrophils, a type of white blood cell important in the body's fight against infection. It is used to reduce the incidence of fever and infection in patients  with certain types of cancer who are receiving chemotherapy that affects the bone marrow, and to increase survival after being exposed to high doses of  radiation. This medicine may be used for other purposes; ask your health care provider or pharmacist if you have questions. What should I tell my health care provider before I take this medicine? They need to know if you have any of these conditions: -kidney disease -latex allergy -ongoing radiation therapy -sickle cell disease -skin reactions to acrylic adhesives (On-Body Injector only) -an unusual or allergic reaction to pegfilgrastim, filgrastim, other medicines, foods, dyes, or preservatives -pregnant or trying to get pregnant -breast-feeding How should I use this medicine? This medicine is for injection under the skin. If you get this medicine at home, you will be taught how to prepare and give the pre-filled syringe or how to use the On-body Injector. Refer to the patient Instructions for Use for detailed instructions. Use exactly as directed. Take your medicine at regular intervals. Do not take your medicine more often than directed. It is important that you put your used needles and syringes in a special sharps container. Do not put them in a trash can. If you do not have a sharps container, call your pharmacist or healthcare provider to get one. Talk to your pediatrician regarding the use of this medicine in children. While this drug may be prescribed for selected conditions, precautions do apply. Overdosage: If you think you have taken too much of this medicine contact a poison control center or emergency room at once. NOTE: This medicine is only for you. Do not share this medicine with others. What if I miss a dose? It is important not to miss your dose. Call your doctor or health care professional if you miss your dose. If you miss a dose due to an On-body Injector failure or leakage, a new dose should be administered as soon as possible using a single prefilled syringe for manual use. What may interact with this medicine? Interactions have not been studied. Give your health care  provider a list of all the medicines, herbs, non-prescription drugs, or dietary supplements you use. Also tell them if you smoke, drink alcohol, or use illegal drugs. Some items may interact with your medicine. This list may not describe all possible interactions. Give your health care provider a list of all the medicines, herbs, non-prescription drugs, or dietary supplements you use. Also tell them if you smoke, drink alcohol, or use illegal drugs. Some items may interact with your medicine. What should I watch for while using this medicine? You may need blood work done while you are taking this medicine. If you are going to need a MRI, CT scan, or other procedure, tell your doctor that you are using this medicine (On-Body Injector only). What side effects may I notice from receiving this medicine? Side effects that you should report to your doctor or health care professional as soon as possible: -allergic reactions like skin rash, itching or hives, swelling of the face, lips, or tongue -dizziness -fever -pain, redness, or irritation at site where injected -pinpoint red spots on the skin -red or dark-brown urine -shortness of breath or breathing problems -stomach or side pain, or pain at the shoulder -swelling -tiredness -trouble passing urine or change in the amount of urine Side effects that usually do not require medical attention (report to your doctor or health care professional if they continue or are bothersome): -bone pain -muscle pain This list may not describe all  possible side effects. Call your doctor for medical advice about side effects. You may report side effects to FDA at 1-800-FDA-1088. Where should I keep my medicine? Keep out of the reach of children. Store pre-filled syringes in a refrigerator between 2 and 8 degrees C (36 and 46 degrees F). Do not freeze. Keep in carton to protect from light. Throw away this medicine if it is left out of the refrigerator for more than  48 hours. Throw away any unused medicine after the expiration date. NOTE: This sheet is a summary. It may not cover all possible information. If you have questions about this medicine, talk to your doctor, pharmacist, or health care provider.    2016, Elsevier/Gold Standard. (2014-05-05 14:30:14)

## 2015-02-14 NOTE — Progress Notes (Signed)
Pt received Taxotere today. Dose decreased. Pt's platelets today are 70. Pt educated/instructed to call the office immediately for any visible signs of bleeding from gums,nose, urine stool or anywhere as chemo will  Likely drop his platelets even lower. Pt receiving neulasta onpro for the 1st time today instructed him on keeping neulasta "patch" dry. Looking for flashing green light and when to take it off. Daughter with patient for teaching instructions. Neulasta fashing green at discharge.

## 2015-02-14 NOTE — Progress Notes (Signed)
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 His status post 2 cycles of chemotherapy. Given ongoing thrombocytopenia and anemia his cycle #3 has been held for the last 3 weeks.  Patient feels better after 1 unit of PRBC transfusion last week.  He denies any unusual chest pain or cough. Denies any fevers. He has mild fatigue. His appetite is improving. His gaining weight. He has noticed "double chin". Also some swelling in the legs. He has mild tingling and numbness in his feet. This is not interrupting his daily lifestyle.  REVIEW OF SYSTEMS:  A complete 10 point review of system is done which is negative except mentioned above/history of present illness.   PAST MEDICAL HISTORY :  Past Medical History  Diagnosis Date  . Prostate cancer metastatic to bone ([place]) 04/2006  . Pneumonia   . Arthritis   . Anemia   . Thrombocytopenia ([place])   . Vertigo     PAST SURGICAL HISTORY :   Past Surgical History  Procedure Laterality Date  . Back surgery    . Cervical disc surgery    . Kidney stone    . Nasal septum surgery    . Endobronchial ultrasound N/A 11/22/2014    Procedure: ENDOBRONCHIAL ULTRASOUND;  Surgeon: Vilinda Boehringer, MD;  Location: [facility] ORS;  Service: Cardiopulmonary;  Laterality: N/A;    FAMILY HISTORY :   Family  History  Problem Relation Age of Onset  . Heart attack Father 22    death  . Diabetes Sister   . COPD Sister   . Emphysema Sister   . Heart Problems Sister   . Diabetes Brother   . Heart Problems Brother   . Liver cancer Brother     SOCIAL HISTORY:   Social History  Substance Use Topics  . Smoking status: Former Smoker -- 1.00 packs/day for 30 years    Types: Cigarettes  . Smokeless tobacco: Never Used  . Alcohol Use: 0.0 oz/week    0 Standard drinks or equivalent per week     Comment: occasional beer    ALLERGIES:  has No Known Allergies.  MEDICATIONS:  Current Outpatient Prescriptions  Medication Sig Dispense Refill  . calcium carbonate (OSCAL) 1500 (600 CA) MG TABS tablet Take 600 mg of elemental calcium by mouth 2 (two) times daily with a meal.    . chlorhexidine (PERIDEX) 0.12 % solution 1 mL by Mouth Rinse route at bedtime.    Marland Kitchen doxazosin (CARDURA) 2 MG tablet Take 2 mg by mouth at bedtime.     . ferrous sulfate 325 (65 FE) MG tablet Take 325 mg by mouth 2 (two) times daily with a meal.    . leuprolide (LUPRON) 11.25 MG KIT injection Inject 1 each into the muscle every 3 (three) months.    . meclizine (ANTIVERT) 25 MG tablet  TAKE ONE TABLET BY MOUTH THREE TIMES DAILY AS NEEDED FOR DIZZINESS 60 tablet 0  . Multiple Vitamin (MULTI-VITAMINS) TABS Take 1 tablet by mouth daily.    . naproxen (NAPROSYN) 500 MG tablet Take 500 mg by mouth 2 (two) times daily with a meal.    . oxyCODONE (OXY IR/ROXICODONE) 5 MG immediate release tablet Take 1 tablet (5 mg total) by mouth 5 (five) times daily as needed for severe pain. 150 tablet 0  . predniSONE (DELTASONE) 5 MG tablet Take 1 tablet (5 mg total) by mouth 2 (two) times daily. 60 tablet 3  . sucralfate (CARAFATE) 1 GM/10ML suspension Take 10 mLs (1 g total) by mouth 4 (four) times daily -  with meals and at bedtime. 420 mL 1  . dexamethasone (DECADRON) 4 MG tablet Take 2 tablets twice a day the day before chemo, the day of chemo  and day after chemo with each treatment 72 tablet 0   No current facility-administered medications for this visit.   Facility-Administered Medications Ordered in Other Visits  Medication Dose Route Frequency Provider Last Rate Last Dose  . Influenza vac split quadrivalent PF (FLUARIX) injection 0.5 mL  0.5 mL Intramuscular Once Leia Alf, MD        PHYSICAL EXAMINATION: ECOG PERFORMANCE STATUS: 1 - Symptomatic but completely ambulatory  BP 134/73 mmHg  Pulse 76  Temp(Src) 97.8 F (36.6 C) (Tympanic)  Resp 18  Ht _0  (1.778 m)  Wt 167 lb (75.751 kg)  BMI 23.96 kg/m2  Filed Weights   02/14/15 0851  Weight: 167 lb (75.751 kg)    GENERAL: Well-nourished well-developed; Alert, no distress and comfortable.   Accompanied by daughter.  EYES: Positive for pallor no icterus OROPHARYNX: no thrush or ulceration; good dentition  NECK: supple, no masses felt LYMPH:  no palpable lymphadenopathy in the cervical, axillary or inguinal regions LUNGS: clear to auscultation and  No wheeze or crackles HEART/CVS: regular rate & rhythm and no murmurs; 1+ bilatera lower extremity edema ABDOMEN:abdomen soft, non-tender and normal bowel sounds Musculoskeletal:no cyanosis of digits and no clubbing  PSYCH: alert & oriented x 3 with fluent speech NEURO: no focal motor/sensory deficits SKIN:  n multiple ecchymosis./Chronic  LABORATORY DATA:  I have reviewed the data as listed    Component Value Date/Time   NA 140 01/18/2015 0949   NA 140 10/06/2013 0949   K 3.5 01/26/2015 0909   K 3.7 10/06/2013 0949   CL 108 01/18/2015 0949   CL 103 10/06/2013 0949   CO2 25 01/18/2015 0949   CO2 29 10/06/2013 0949   GLUCOSE 93 01/18/2015 0949   GLUCOSE 116* 10/06/2013 0949   BUN 16 01/18/2015 0949   BUN 13 10/06/2013 0949   CREATININE 0.66 01/26/2015 0909   CREATININE 0.83 04/07/2014 0929   CALCIUM 9.3 01/18/2015 0949   CALCIUM 9.2 04/07/2014 0929   PROT 5.9* 01/18/2015 0949   PROT 6.6 04/07/2014  0929   ALBUMIN 2.9* 01/18/2015 0949   ALBUMIN 3.5 04/07/2014 0929   AST 22 01/18/2015 0949   AST 17 04/07/2014 0929   ALT 15* 01/18/2015 0949   ALT 25 04/07/2014 0929   ALKPHOS 99 01/18/2015 0949   ALKPHOS 99 04/07/2014 0929   BILITOT 0.3 01/18/2015 0949   BILITOT 0.4 04/07/2014 0929   GFRNONAA >60 01/26/2015 0909   GFRNONAA >60 04/07/2014 0929   GFRNONAA >60 01/13/2014 0931   GFRAA >60 01/26/2015 0909   GFRAA >60 04/07/2014 0929   GFRAA >60 01/13/2014 7824  No results found for: SPEP, UPEP  Lab Results  Component Value Date   WBC 5.2 02/14/2015   NEUTROABS 4.5 02/14/2015   HGB 7.9* 02/14/2015   HCT 24.2* 02/14/2015   MCV 97.8 02/14/2015   PLT 70* 02/14/2015      Chemistry      Component Value Date/Time   NA 140 01/18/2015 0949   NA 140 10/06/2013 0949   K 3.5 01/26/2015 0909   K 3.7 10/06/2013 0949   CL 108 01/18/2015 0949   CL 103 10/06/2013 0949   CO2 25 01/18/2015 0949   CO2 29 10/06/2013 0949   BUN 16 01/18/2015 0949   BUN 13 10/06/2013 0949   CREATININE 0.66 01/26/2015 0909   CREATININE 0.83 04/07/2014 0929      Component Value Date/Time   CALCIUM 9.3 01/18/2015 0949   CALCIUM 9.2 04/07/2014 0929   ALKPHOS 99 01/18/2015 0949   ALKPHOS 99 04/07/2014 0929   AST 22 01/18/2015 0949   AST 17 04/07/2014 0929   ALT 15* 01/18/2015 0949   ALT 25 04/07/2014 0929   BILITOT 0.3 01/18/2015 0949   BILITOT 0.4 04/07/2014 0929       RADIOGRAPHIC STUDIES: I have personally reviewed the radiological images as listed and agreed with the findings in the report. No results found.   ASSESSMENT & PLAN:   # METASTATIC PROSTATE CA -CASTRATE RESISTANT: Currently on fourth line therapy with Lupron q 8M /Taxotere q3W; status post cycle #2. Patient had improvement of the PSA from 935 in July 2016 to 801 September 2016 just after 2 cycles of chemotherapy. Also is having improved appetite/ weight gain.  Cycle #3 has been held because of ongoing thrombocytopenia. Today  platelet count is 70hemoglobin is 7.9; white count is 5.4.  I had a long discussion the patient and his daughter regarding the difficult situation; given the progressive/aggressive prostate cancer and the inability to continue chemotherapy at standard dose given the low platelets. After the long discussion we have decided to proceed with chemotherapy with today's platelet count.   I would recommend dose reduction by about 20% given the ongoing cytopenias. Appropriate dose reduction has been done in the chemotherapy orders.   #Thrombocytopenia grade 2; Check CBC weekly. Patient is not on any antiplatelet therapy.  # Anemia secondary to chemotherapy- today hemoglobin 7.9; fairly asymptomatic.repeat CBC weekly basis. Transfuse asymptomatic.   #  Met from Prostate vs LUNG CA  [2016] s/p RT [Aug 2016; Dr.Crystal].    # Mets to Bone: on X-geva SQ q 4W; no concerns for osteonecrosis of the jaw.   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    No orders of the defined types were placed in this encounter.   All questions were answered. The patient knows to call the clinic with any problems, questions or concerns. No barriers to learning was detected.  I spent 25 minutes counseling the patient face to face. The total time spent in the appointment was 30 minutes and more than 50% was on counseling and review of test results     Cammie Sickle, MD 02/14/2015 9:27 AM

## 2015-02-14 NOTE — Telephone Encounter (Signed)
Plt =70.  MD wants to go ahead with treatment of taxotere today.

## 2015-02-14 NOTE — Progress Notes (Signed)
Decadron RF escribed to pharmacy.

## 2015-02-16 ENCOUNTER — Other Ambulatory Visit: Payer: Medicare Other

## 2015-02-16 ENCOUNTER — Other Ambulatory Visit: Payer: Self-pay | Admitting: Pain Medicine

## 2015-02-21 ENCOUNTER — Inpatient Hospital Stay: Payer: Medicare Other

## 2015-02-21 DIAGNOSIS — Z23 Encounter for immunization: Secondary | ICD-10-CM

## 2015-02-21 DIAGNOSIS — C61 Malignant neoplasm of prostate: Secondary | ICD-10-CM

## 2015-02-21 DIAGNOSIS — Z5111 Encounter for antineoplastic chemotherapy: Secondary | ICD-10-CM | POA: Diagnosis not present

## 2015-02-21 DIAGNOSIS — C7951 Secondary malignant neoplasm of bone: Secondary | ICD-10-CM

## 2015-02-21 LAB — CBC
HEMATOCRIT: 22 % — AB (ref 40.0–52.0)
HEMOGLOBIN: 7.2 g/dL — AB (ref 13.0–18.0)
MCH: 31.6 pg (ref 26.0–34.0)
MCHC: 32.5 g/dL (ref 32.0–36.0)
MCV: 97.4 fL (ref 80.0–100.0)
Platelets: 47 10*3/uL — ABNORMAL LOW (ref 150–440)
RBC: 2.26 MIL/uL — AB (ref 4.40–5.90)
RDW: 24.8 % — ABNORMAL HIGH (ref 11.5–14.5)
WBC: 5.7 10*3/uL (ref 3.8–10.6)

## 2015-02-21 LAB — SAMPLE TO BLOOD BANK

## 2015-02-23 ENCOUNTER — Ambulatory Visit
Admission: RE | Admit: 2015-02-23 | Discharge: 2015-02-23 | Disposition: A | Discharge status: Home / Self Care | Payer: Medicare Other | Source: Ambulatory Visit | Attending: Internal Medicine | Admitting: Internal Medicine

## 2015-02-23 DIAGNOSIS — C3490 Malignant neoplasm of unspecified part of unspecified bronchus or lung: Secondary | ICD-10-CM | POA: Insufficient documentation

## 2015-02-23 DIAGNOSIS — C7951 Secondary malignant neoplasm of bone: Secondary | ICD-10-CM | POA: Insufficient documentation

## 2015-02-23 DIAGNOSIS — C61 Malignant neoplasm of prostate: Secondary | ICD-10-CM

## 2015-02-23 DIAGNOSIS — K8020 Calculus of gallbladder without cholecystitis without obstruction: Secondary | ICD-10-CM | POA: Diagnosis not present

## 2015-02-23 DIAGNOSIS — Z23 Encounter for immunization: Secondary | ICD-10-CM

## 2015-02-23 MED ORDER — IOHEXOL 300 MG/ML  SOLN
100.0000 mL | Freq: Once | INTRAMUSCULAR | Status: AC | PRN
  Administered 2015-02-23: 100 mL via INTRAVENOUS

## 2015-02-28 ENCOUNTER — Other Ambulatory Visit: Payer: Medicare Other | Admitting: Internal Medicine

## 2015-02-28 ENCOUNTER — Other Ambulatory Visit: Payer: Medicare Other

## 2015-02-28 ENCOUNTER — Ambulatory Visit: Payer: Medicare Other

## 2015-02-28 ENCOUNTER — Telehealth: Payer: Self-pay | Admitting: *Deleted

## 2015-02-28 ENCOUNTER — Inpatient Hospital Stay: Payer: Medicare Other | Attending: Internal Medicine

## 2015-02-28 ENCOUNTER — Ambulatory Visit: Payer: Medicare Other | Admitting: Internal Medicine

## 2015-02-28 DIAGNOSIS — R0602 Shortness of breath: Secondary | ICD-10-CM | POA: Insufficient documentation

## 2015-02-28 DIAGNOSIS — M7989 Other specified soft tissue disorders: Secondary | ICD-10-CM | POA: Insufficient documentation

## 2015-02-28 DIAGNOSIS — C7951 Secondary malignant neoplasm of bone: Secondary | ICD-10-CM | POA: Insufficient documentation

## 2015-02-28 DIAGNOSIS — T451X5A Adverse effect of antineoplastic and immunosuppressive drugs, initial encounter: Principal | ICD-10-CM

## 2015-02-28 DIAGNOSIS — Z5111 Encounter for antineoplastic chemotherapy: Secondary | ICD-10-CM | POA: Diagnosis present

## 2015-02-28 DIAGNOSIS — Z87891 Personal history of nicotine dependence: Secondary | ICD-10-CM | POA: Insufficient documentation

## 2015-02-28 DIAGNOSIS — Z79899 Other long term (current) drug therapy: Secondary | ICD-10-CM | POA: Insufficient documentation

## 2015-02-28 DIAGNOSIS — R609 Edema, unspecified: Secondary | ICD-10-CM | POA: Insufficient documentation

## 2015-02-28 DIAGNOSIS — D6481 Anemia due to antineoplastic chemotherapy: Secondary | ICD-10-CM | POA: Diagnosis not present

## 2015-02-28 DIAGNOSIS — Z23 Encounter for immunization: Secondary | ICD-10-CM

## 2015-02-28 DIAGNOSIS — R05 Cough: Secondary | ICD-10-CM | POA: Diagnosis not present

## 2015-02-28 DIAGNOSIS — C61 Malignant neoplasm of prostate: Secondary | ICD-10-CM | POA: Diagnosis not present

## 2015-02-28 DIAGNOSIS — R5383 Other fatigue: Secondary | ICD-10-CM | POA: Insufficient documentation

## 2015-02-28 DIAGNOSIS — R6884 Jaw pain: Secondary | ICD-10-CM | POA: Diagnosis not present

## 2015-02-28 DIAGNOSIS — Z7689 Persons encountering health services in other specified circumstances: Secondary | ICD-10-CM | POA: Diagnosis not present

## 2015-02-28 DIAGNOSIS — G629 Polyneuropathy, unspecified: Secondary | ICD-10-CM | POA: Insufficient documentation

## 2015-02-28 DIAGNOSIS — M8788 Other osteonecrosis, other site: Secondary | ICD-10-CM | POA: Diagnosis not present

## 2015-02-28 DIAGNOSIS — D696 Thrombocytopenia, unspecified: Secondary | ICD-10-CM | POA: Diagnosis not present

## 2015-02-28 DIAGNOSIS — T451X5S Adverse effect of antineoplastic and immunosuppressive drugs, sequela: Secondary | ICD-10-CM | POA: Diagnosis not present

## 2015-02-28 LAB — CBC
HEMATOCRIT: 21.7 % — AB (ref 40.0–52.0)
Hemoglobin: 7 g/dL — ABNORMAL LOW (ref 13.0–18.0)
MCH: 32.2 pg (ref 26.0–34.0)
MCHC: 32.2 g/dL (ref 32.0–36.0)
MCV: 100.2 fL — AB (ref 80.0–100.0)
Platelets: 57 10*3/uL — ABNORMAL LOW (ref 150–440)
RBC: 2.16 MIL/uL — ABNORMAL LOW (ref 4.40–5.90)
RDW: 25.3 % — AB (ref 11.5–14.5)
WBC: 10 10*3/uL (ref 3.8–10.6)

## 2015-02-28 LAB — SAMPLE TO BLOOD BANK

## 2015-02-28 LAB — COMPREHENSIVE METABOLIC PANEL
ALBUMIN: 3.4 g/dL — AB (ref 3.5–5.0)
ALT: 39 U/L (ref 17–63)
AST: 24 U/L (ref 15–41)
Alkaline Phosphatase: 126 U/L (ref 38–126)
Anion gap: 11 (ref 5–15)
BILIRUBIN TOTAL: 0.6 mg/dL (ref 0.3–1.2)
BUN: 24 mg/dL — AB (ref 6–20)
CHLORIDE: 100 mmol/L — AB (ref 101–111)
CO2: 25 mmol/L (ref 22–32)
CREATININE: 0.89 mg/dL (ref 0.61–1.24)
Calcium: 8.1 mg/dL — ABNORMAL LOW (ref 8.9–10.3)
GFR calc Af Amer: >60 mL/min (ref >60)
GLUCOSE: 120 mg/dL — AB (ref 65–99)
POTASSIUM: 3.6 mmol/L (ref 3.5–5.1)
Sodium: 136 mmol/L (ref 135–145)
TOTAL PROTEIN: 5.7 g/dL — AB (ref 6.5–8.1)

## 2015-02-28 LAB — PREPARE RBC (CROSSMATCH)

## 2015-02-28 NOTE — Telephone Encounter (Signed)
Patient states that he feels short of breath and very weak. He states that he would like to come to [facility] tomorrow to have his transfusion. Per Mickel Baas, there are not available slots in [facility] [place] tomorrow or Thursday for a 6 hours blood transfusion.  Spoke with the patient, he is agreeable to come to [facility] for the transfusion.  Pt given an appointment for patient to get 2 units of blood tomorrow. Pt instructed to keep his blood bank band on for 3 days.  Spoke with [place] lab, who will ensure that patient's blood bank tube gets to blood bank. Mickel Baas notified cancer center charge nurse in [place] of the schedule add on for transfusion.

## 2015-02-28 NOTE — Telephone Encounter (Signed)
-----   Message from Cammie Sickle, MD sent at 02/28/2015 11:39 AM EDT ----- Please inform patient about hemoglobin at 7; if he is feeling okay- hold off transfusion; however if he is feeling weak/short of breath recommend 2 units of PRBC transfusion.

## 2015-03-01 ENCOUNTER — Inpatient Hospital Stay: Payer: Medicare Other

## 2015-03-01 VITALS — BP 134/79 | HR 90 | Temp 98.3°F | Resp 18

## 2015-03-01 DIAGNOSIS — D6481 Anemia due to antineoplastic chemotherapy: Secondary | ICD-10-CM

## 2015-03-01 DIAGNOSIS — Z5111 Encounter for antineoplastic chemotherapy: Secondary | ICD-10-CM | POA: Diagnosis not present

## 2015-03-01 DIAGNOSIS — T451X5A Adverse effect of antineoplastic and immunosuppressive drugs, initial encounter: Principal | ICD-10-CM

## 2015-03-01 MED ORDER — DIPHENHYDRAMINE HCL 25 MG PO CAPS
25.0000 mg | ORAL_CAPSULE | Freq: Once | ORAL | Status: AC
  Administered 2015-03-01: 25 mg via ORAL
  Filled 2015-03-01: qty 1

## 2015-03-01 MED ORDER — ACETAMINOPHEN 325 MG PO TABS
650.0000 mg | ORAL_TABLET | Freq: Once | ORAL | Status: AC
  Administered 2015-03-01: 650 mg via ORAL
  Filled 2015-03-01: qty 2

## 2015-03-01 MED ORDER — FUROSEMIDE 10 MG/ML IJ SOLN
20.0000 mg | Freq: Once | INTRAMUSCULAR | Status: AC
  Administered 2015-03-01: 20 mg via INTRAVENOUS
  Filled 2015-03-01: qty 2

## 2015-03-01 MED ORDER — SODIUM CHLORIDE 0.9 % IV SOLN
250.0000 mL | Freq: Once | INTRAVENOUS | Status: AC
  Administered 2015-03-01: 250 mL via INTRAVENOUS
  Filled 2015-03-01: qty 250

## 2015-03-02 ENCOUNTER — Encounter: Payer: Self-pay | Admitting: Pain Medicine

## 2015-03-02 ENCOUNTER — Ambulatory Visit: Payer: Medicare Other | Attending: Pain Medicine | Admitting: Pain Medicine

## 2015-03-02 VITALS — BP 141/70 | HR 85 | Temp 98.7°F | Resp 18 | Ht 70.0 in | Wt 160.0 lb

## 2015-03-02 DIAGNOSIS — M47816 Spondylosis without myelopathy or radiculopathy, lumbar region: Secondary | ICD-10-CM | POA: Insufficient documentation

## 2015-03-02 DIAGNOSIS — G8929 Other chronic pain: Secondary | ICD-10-CM | POA: Insufficient documentation

## 2015-03-02 DIAGNOSIS — C7951 Secondary malignant neoplasm of bone: Secondary | ICD-10-CM | POA: Diagnosis not present

## 2015-03-02 DIAGNOSIS — C61 Malignant neoplasm of prostate: Secondary | ICD-10-CM | POA: Insufficient documentation

## 2015-03-02 DIAGNOSIS — Z79891 Long term (current) use of opiate analgesic: Secondary | ICD-10-CM | POA: Diagnosis not present

## 2015-03-02 DIAGNOSIS — F1120 Opioid dependence, uncomplicated: Secondary | ICD-10-CM

## 2015-03-02 DIAGNOSIS — M545 Low back pain, unspecified: Secondary | ICD-10-CM

## 2015-03-02 DIAGNOSIS — M79605 Pain in left leg: Secondary | ICD-10-CM | POA: Insufficient documentation

## 2015-03-02 DIAGNOSIS — I10 Essential (primary) hypertension: Secondary | ICD-10-CM | POA: Diagnosis not present

## 2015-03-02 DIAGNOSIS — F17200 Nicotine dependence, unspecified, uncomplicated: Secondary | ICD-10-CM | POA: Insufficient documentation

## 2015-03-02 DIAGNOSIS — M549 Dorsalgia, unspecified: Secondary | ICD-10-CM | POA: Diagnosis present

## 2015-03-02 DIAGNOSIS — M25561 Pain in right knee: Secondary | ICD-10-CM

## 2015-03-02 DIAGNOSIS — F1190 Opioid use, unspecified, uncomplicated: Secondary | ICD-10-CM | POA: Diagnosis not present

## 2015-03-02 DIAGNOSIS — Z5181 Encounter for therapeutic drug level monitoring: Secondary | ICD-10-CM

## 2015-03-02 DIAGNOSIS — M546 Pain in thoracic spine: Secondary | ICD-10-CM | POA: Insufficient documentation

## 2015-03-02 DIAGNOSIS — Z79899 Other long term (current) drug therapy: Secondary | ICD-10-CM | POA: Diagnosis not present

## 2015-03-02 DIAGNOSIS — M47896 Other spondylosis, lumbar region: Secondary | ICD-10-CM

## 2015-03-02 DIAGNOSIS — G893 Neoplasm related pain (acute) (chronic): Secondary | ICD-10-CM | POA: Diagnosis not present

## 2015-03-02 LAB — TYPE AND SCREEN
ABO/RH(D): O NEG
Antibody Screen: NEGATIVE
UNIT DIVISION: 0
Unit division: 0

## 2015-03-02 MED ORDER — OXYCODONE HCL 5 MG PO CAPS: 5.0000 mg | ORAL_CAPSULE | Freq: Every day | ORAL | Status: DC | PRN

## 2015-03-02 MED ORDER — OXYCODONE HCL 5 MG PO TABS: 5.0000 mg | ORAL_TABLET | Freq: Every day | ORAL | Status: AC | PRN

## 2015-03-02 NOTE — Progress Notes (Signed)
Safety precautions to be maintained throughout the outpatient stay will include: orient to surroundings, keep bed in low position, maintain call bell within reach at all times, provide assistance with transfer out of bed and ambulation.  

## 2015-03-02 NOTE — Progress Notes (Signed)
Patient's Name: Dustin Frazier MRN: [id number] DOB: [birth year] DOS: 03/02/2015  Primary Reason(s) for Visit: Encounter for Medication Management. CC: Back Pain   HPI:   Dustin Frazier is a 74 y.o. year old, male patient, who returns today as an established patient. 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[place]); BP (high blood pressure); Incomplete bladder emptying; CA of prostate ([place]); Metastasis from malignant tumor of prostate ([place]); Bone metastases ([place]); Obstruction of urinary tract; Persons encountering health services in other specified circumstances; Malignant neoplasm of prostate ([place]); Malignant neoplasm metastatic to bone [facility]); Long term current use of opiate analgesic; Long term prescription opiate use; Chronic pain; Lumbar spondylosis; Chronic pain of right knee; Chronic upper back pain; Opiate use; Opioid dependence ([place]); Encounter for therapeutic drug level monitoring; and Chronic pain of left lower extremity on his problem list.. His primarily concern today is the Back Pain    The patient returns today indicating that the change that we made to his medications worked great. He does not want to change anything else at this time. He just had a transfusion done yesterday he says that he feels much better. He still undergoing chemotherapy and he is in his fourth of also believes 6 treatments. Today's Pain Score: 2  Pain Location: Back Pain Orientation: Lower Pain Descriptors / Indicators: Aching, Constant, Stabbing, Tingling  Date of Last Visit: Date of Last Visit: 01/31/15 Service Provided on Last Visit: Service Provided on Last Visit:  (New  medication increase)  Pharmacotherapy Review:   Side-effects or Adverse reactions: None reported. Effectiveness: Described as relatively effective, allowing for increase in activities of daily living (ADL). Onset of action: Within expected pharmacological parameters. Duration of action: Within normal limits for medication. Peak effect: Timing and results are as within normal expected parameters. [place] PMP: Compliant with practice rules and regulations. DST: Compliant with practice rules and regulations. Lab work: No new labs ordered by our practice. Treatment compliance: Compliant. Substance Use Disorder (SUD) Risk Level: Low Planned course of action: Continue therapy as is.  Allergies: Dustin Frazier has No Known Allergies.  Meds: The patient has a current medication list which includes the following prescription(s): calcium carbonate, chlorhexidine, dexamethasone, doxazosin, ferrous sulfate, leuprolide, meclizine, multi-vitamins, naproxen, prednisone, sucralfate, oxycodone, oxycodone, and oxycodone, and the following Facility-Administered Medications: influenza vac split quadrivalent pf. Requested Prescriptions   Signed Prescriptions Disp Refills  . oxyCODONE (OXY IR/ROXICODONE) 5 MG immediate release tablet 150 tablet 0    Sig: Take 1 tablet (5 mg total) by mouth 5 (five) times daily as needed for severe pain.  Marland Kitchen oxycodone (OXY-IR) 5 MG capsule 150 capsule 0    Sig: Take 1 capsule (5 mg total) by mouth 5 (five) times daily as needed.  Marland Kitchen oxycodone (OXY-IR) 5 MG capsule 150 capsule 0    Sig: Take 1 capsule (5 mg total) by mouth 5 (five) times daily as needed for pain.    ROS: Constitutional: Afebrile, no chills, well hydrated and well nourished Gastrointestinal: negative Musculoskeletal:negative Neurological: negative Behavioral/Psych: negative  PFSH: Medical:  Dustin Frazier  has a past medical history of Prostate cancer metastatic to bone [facility]) (04/2006); Pneumonia; Arthritis; Anemia;  Thrombocytopenia ([place]); and Vertigo. Family: family history includes COPD in his sister; Diabetes in his  brother and sister; Emphysema in his sister; Heart Problems in his brother and sister; Heart attack (age of onset: 58) in his father; Liver cancer in his brother. Surgical:  has past surgical history that includes Back surgery; Cervical disc surgery; kidney stone; Nasal septum surgery; and Endobronchial ultrasound (N/A, 11/22/2014). Tobacco:  reports that he has quit smoking. His smoking use included Cigarettes. He has a 30 pack-year smoking history. He has never used smokeless tobacco. Alcohol:  reports that he drinks alcohol. Drug:  reports that he does not use illicit drugs.  Physical Exam: Vitals:  Today's Vitals   03/02/15 1001 03/02/15 1005  BP: 141/70   Pulse: 85   Temp: 98.7 F (37.1 C)   TempSrc: Oral   Resp: 18   Height: '5\' 10"'$  (1.778 m)   Weight: 160 lb (72.576 kg)   SpO2: 100%   PainSc:  2   Calculated BMI: Body mass index is 22.96 kg/(m^2). General appearance: alert, cooperative, appears stated age and no distress Eyes: conjunctivae/corneas clear. PERRL, EOM's intact. Fundi benign. Lungs: No evidence respiratory distress, no audible rales or ronchi and no use of accessory muscles of respiration Neck: no adenopathy, no carotid bruit, no JVD, supple, symmetrical, trachea midline and thyroid not enlarged, symmetric, no tenderness/mass/nodules Back: symmetric, no curvature. ROM normal. No CVA tenderness. Extremities: extremities normal, atraumatic, no cyanosis or edema Pulses: 2+ and symmetric Skin: Skin color, texture, turgor normal. No rashes or lesions Neurologic: Grossly normal    Assessment: Encounter Diagnosis:  Primary Diagnosis: Cancer related pain [G89.3]  Plan: Dustin Frazier was seen today for back pain.  Diagnoses and all orders for this visit:  Cancer related pain  Long term current use of opiate analgesic -     Drugs of abuse screen w/o alc, rtn  urine-sln; Future  Long term prescription opiate use  Chronic pain -     oxyCODONE (OXY IR/ROXICODONE) 5 MG immediate release tablet; Take 1 tablet (5 mg total) by mouth 5 (five) times daily as needed for severe pain. -     oxycodone (OXY-IR) 5 MG capsule; Take 1 capsule (5 mg total) by mouth 5 (five) times daily as needed. -     oxycodone (OXY-IR) 5 MG capsule; Take 1 capsule (5 mg total) by mouth 5 (five) times daily as needed for pain.  Chronic low back pain  Prostate cancer metastatic to bone [facility])  Other osteoarthritis of spine, lumbar region  Chronic pain of right knee  Chronic upper back pain  Opiate use  Uncomplicated opioid dependence ([place])  Encounter for therapeutic drug level monitoring  Chronic pain of left lower extremity     There are no Patient Instructions on file for this visit. Medications discontinued today:  Medications Discontinued During This Encounter  Medication Reason  . oxyCODONE (OXY IR/ROXICODONE) 5 MG immediate release tablet Reorder   Medications administered today:  Dustin Frazier had no medications administered during this visit.  Primary Care Physician: [name], MD Location: [facility] Outpatient Pain Management Facility Note by: Kathlen Brunswick. Dossie Arbour, M.D, DABA, DABAPM, DABPM, DABIPP, FIPP

## 2015-03-03 DIAGNOSIS — Z5181 Encounter for therapeutic drug level monitoring: Secondary | ICD-10-CM | POA: Insufficient documentation

## 2015-03-03 DIAGNOSIS — G8929 Other chronic pain: Secondary | ICD-10-CM | POA: Insufficient documentation

## 2015-03-03 DIAGNOSIS — M549 Dorsalgia, unspecified: Secondary | ICD-10-CM

## 2015-03-03 DIAGNOSIS — F1120 Opioid dependence, uncomplicated: Secondary | ICD-10-CM | POA: Insufficient documentation

## 2015-03-03 DIAGNOSIS — F1190 Opioid use, unspecified, uncomplicated: Secondary | ICD-10-CM | POA: Insufficient documentation

## 2015-03-03 DIAGNOSIS — M79605 Pain in left leg: Secondary | ICD-10-CM

## 2015-03-07 ENCOUNTER — Inpatient Hospital Stay (HOSPITAL_BASED_OUTPATIENT_CLINIC_OR_DEPARTMENT_OTHER): Payer: Medicare Other | Admitting: Internal Medicine

## 2015-03-07 ENCOUNTER — Inpatient Hospital Stay: Payer: Medicare Other

## 2015-03-07 ENCOUNTER — Telehealth: Payer: Self-pay

## 2015-03-07 VITALS — Ht 70.0 in | Wt 169.8 lb

## 2015-03-07 VITALS — BP 138/74 | HR 72 | Temp 96.7°F | Resp 18

## 2015-03-07 DIAGNOSIS — D696 Thrombocytopenia, unspecified: Secondary | ICD-10-CM | POA: Diagnosis not present

## 2015-03-07 DIAGNOSIS — C61 Malignant neoplasm of prostate: Secondary | ICD-10-CM

## 2015-03-07 DIAGNOSIS — Z7689 Persons encountering health services in other specified circumstances: Secondary | ICD-10-CM

## 2015-03-07 DIAGNOSIS — R609 Edema, unspecified: Secondary | ICD-10-CM

## 2015-03-07 DIAGNOSIS — Z23 Encounter for immunization: Secondary | ICD-10-CM

## 2015-03-07 DIAGNOSIS — R05 Cough: Secondary | ICD-10-CM

## 2015-03-07 DIAGNOSIS — Z5111 Encounter for antineoplastic chemotherapy: Secondary | ICD-10-CM | POA: Diagnosis not present

## 2015-03-07 DIAGNOSIS — Z87891 Personal history of nicotine dependence: Secondary | ICD-10-CM

## 2015-03-07 DIAGNOSIS — IMO0001 Reserved for inherently not codable concepts without codable children: Secondary | ICD-10-CM

## 2015-03-07 DIAGNOSIS — C7951 Secondary malignant neoplasm of bone: Secondary | ICD-10-CM | POA: Diagnosis not present

## 2015-03-07 DIAGNOSIS — D6481 Anemia due to antineoplastic chemotherapy: Secondary | ICD-10-CM

## 2015-03-07 DIAGNOSIS — G629 Polyneuropathy, unspecified: Secondary | ICD-10-CM

## 2015-03-07 DIAGNOSIS — T451X5A Adverse effect of antineoplastic and immunosuppressive drugs, initial encounter: Secondary | ICD-10-CM

## 2015-03-07 DIAGNOSIS — Z79899 Other long term (current) drug therapy: Secondary | ICD-10-CM

## 2015-03-07 LAB — SAMPLE TO BLOOD BANK

## 2015-03-07 LAB — CBC
HEMATOCRIT: 29.2 % — AB (ref 40.0–52.0)
Hemoglobin: 9.4 g/dL — ABNORMAL LOW (ref 13.0–18.0)
MCH: 30.5 pg (ref 26.0–34.0)
MCHC: 32.3 g/dL (ref 32.0–36.0)
MCV: 94.5 fL (ref 80.0–100.0)
Platelets: 53 10*3/uL — ABNORMAL LOW (ref 150–440)
RBC: 3.09 MIL/uL — ABNORMAL LOW (ref 4.40–5.90)
RDW: 26.3 % — AB (ref 11.5–14.5)
WBC: 10.1 10*3/uL (ref 3.8–10.6)

## 2015-03-07 MED ORDER — FUROSEMIDE 20 MG PO TABS: ORAL_TABLET | ORAL | Status: AC

## 2015-03-07 MED ORDER — DIPHENHYDRAMINE HCL 50 MG/ML IJ SOLN
25.0000 mg | Freq: Once | INTRAMUSCULAR | Status: AC
  Administered 2015-03-07: 25 mg via INTRAVENOUS
  Filled 2015-03-07: qty 1

## 2015-03-07 MED ORDER — FAMOTIDINE IN NACL 20-0.9 MG/50ML-% IV SOLN
20.0000 mg | Freq: Two times a day (BID) | INTRAVENOUS | Status: DC
  Administered 2015-03-07: 20 mg via INTRAVENOUS
  Filled 2015-03-07: qty 50

## 2015-03-07 MED ORDER — PEGFILGRASTIM 6 MG/0.6ML ~~LOC~~ PSKT
6.0000 mg | PREFILLED_SYRINGE | Freq: Once | SUBCUTANEOUS | Status: AC
  Administered 2015-03-07: 6 mg via SUBCUTANEOUS
  Filled 2015-03-07: qty 0.6

## 2015-03-07 MED ORDER — ALBUTEROL SULFATE HFA 108 (90 BASE) MCG/ACT IN AERS: 2.0000 | INHALATION_SPRAY | Freq: Four times a day (QID) | RESPIRATORY_TRACT | Status: AC | PRN

## 2015-03-07 MED ORDER — SODIUM CHLORIDE 0.9 % IV SOLN
Freq: Once | INTRAVENOUS | Status: AC
  Administered 2015-03-07: 11:00:00 via INTRAVENOUS
  Filled 2015-03-07: qty 1000

## 2015-03-07 MED ORDER — DENOSUMAB 120 MG/1.7ML ~~LOC~~ SOLN
120.0000 mg | Freq: Once | SUBCUTANEOUS | Status: AC
  Administered 2015-03-07: 120 mg via SUBCUTANEOUS
  Filled 2015-03-07: qty 1.7

## 2015-03-07 MED ORDER — SODIUM CHLORIDE 0.9 % IV SOLN
Freq: Once | INTRAVENOUS | Status: AC
  Administered 2015-03-07: 12:00:00 via INTRAVENOUS
  Filled 2015-03-07: qty 8

## 2015-03-07 MED ORDER — DOCETAXEL CHEMO INJECTION 160 MG/16ML
50.0000 mg/m2 | Freq: Once | INTRAVENOUS | Status: AC
  Administered 2015-03-07: 100 mg via INTRAVENOUS
  Filled 2015-03-07: qty 10

## 2015-03-07 NOTE — Telephone Encounter (Signed)
Platelets =53.  MD wants to go ahead with treatment today, decreasing docetaxel dose.

## 2015-03-07 NOTE — Progress Notes (Signed)
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] [2008- Dx Prostate ca Gleason 7]; STARTED DEC 2011-Zytiga + Pred; # X-Tandi; # Xofigo; July PSA 935 # Aug 2016- START TAXOTERE; PSA SEP 2016- 803; NOV 2016- dose reduced to 8m/m2   # July 2016- LUL 2-3 cm Hilar lesion x 2- Likely Mets from prostate Ca vs Primary Lung- Bx- Epitheloid [[facility] inconclusive] s/p RT Aug 2016; Dr.Crystal]; NOV 2016- CT LUL/R.Adrenal/bone mets- STABLE  INTERVAL HISTORY: A very pleasant 74year old male patient with above history of 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 He is status post 3 cycles of chemotherapy.  Patient feels better after 1 unit of PRBC transfusion last week.  He continues to complain of cough expiration the morning with phlegm. No hemoptysis. He denies any significant shortness of breath. He complains of leg swelling. Denies any fevers. He has mild fatigue. His appetite is improving. His gaining weight. He has noticed "double chin". He has mild tingling and numbness in his feet. This is not interrupting his daily lifestyle.  REVIEW OF SYSTEMS:  A complete 10 point review of system is done which is negative except mentioned above/history of present illness.   PAST MEDICAL HISTORY :  Past Medical History  Diagnosis Date  . Prostate cancer [place] to bone ([place] 04[place]2006  . Pneumonia   . Arthritis   . Anemia   . Thrombocytopenia ([place]   . Vertigo     PAST SURGICAL HISTORY :   Past Surgical History  Procedure Laterality Date  . Back surgery    . Cervical disc surgery    . Kidney stone    . Nasal septum surgery    . Endobronchial ultrasound N/A 11/22/2014    Procedure: ENDOBRONCHIAL ULTRASOUND;  Surgeon: VVilinda Boehringer MD;   Location: [facility] ORS;  Service: Cardiopulmonary;  Laterality: N/A;    FAMILY HISTORY :   Family History  Problem Relation Age of Onset  . Heart attack Father 320   death  . Diabetes Sister   . COPD Sister   . Emphysema Sister   . Heart Problems Sister   . Diabetes Brother   . Heart Problems Brother   . Liver cancer Brother     SOCIAL HISTORY:   Social History  Substance Use Topics  . Smoking status: Former Smoker -- 1.00 packs/day for 30 years    Types: Cigarettes  . Smokeless tobacco: Never Used  . Alcohol Use: 0.0 oz/week    0 Standard drinks or equivalent per week     Comment: occasional beer    ALLERGIES:  has No Known Allergies.  MEDICATIONS:  Current Outpatient Prescriptions  Medication Sig Dispense Refill  . calcium carbonate (OSCAL) 1500 (600 CA) MG TABS tablet Take 600 mg of elemental calcium by mouth 2 (two) times daily with a meal.    . chlorhexidine (PERIDEX) 0.12 % solution 1 mL by Mouth Rinse route at bedtime.    .Marland Kitchendexamethasone (DECADRON) 4 MG tablet Take 2 tablets twice a day the day before chemo, the day of chemo and day after chemo with each treatment 72 tablet 0  . doxazosin (CARDURA) 2 MG tablet Take 2 mg by mouth at bedtime.     . ferrous sulfate 325 (65 FE) MG  tablet Take 325 mg by mouth 2 (two) times daily with a meal.    . furosemide (LASIX) 20 MG tablet Take 20 mg by mouth daily.    Marland Kitchen leuprolide (LUPRON) 11.25 MG KIT injection Inject 1 each into the muscle every 3 (three) months.    . meclizine (ANTIVERT) 25 MG tablet TAKE ONE TABLET BY MOUTH THREE TIMES DAILY AS NEEDED FOR DIZZINESS 60 tablet 0  . Multiple Vitamin (MULTI-VITAMINS) TABS Take 1 tablet by mouth daily.    . naproxen (NAPROSYN) 500 MG tablet Take 500 mg by mouth 2 (two) times daily with a meal.    . oxyCODONE (OXY IR/ROXICODONE) 5 MG immediate release tablet Take 1 tablet (5 mg total) by mouth 5 (five) times daily as needed for severe pain. 150 tablet 0  . predniSONE (DELTASONE) 5 MG  tablet Take 1 tablet (5 mg total) by mouth 2 (two) times daily. 60 tablet 3  . sucralfate (CARAFATE) 1 GM/10ML suspension Take 10 mLs (1 g total) by mouth 4 (four) times daily -  with meals and at bedtime. 420 mL 1   No current facility-administered medications for this visit.   Facility-Administered Medications Ordered in Other Visits  Medication Dose Route Frequency Provider Last Rate Last Dose  . Influenza vac split quadrivalent PF (FLUARIX) injection 0.5 mL  0.5 mL Intramuscular Once Leia Alf, MD        PHYSICAL EXAMINATION: ECOG PERFORMANCE STATUS: 1 - Symptomatic but completely ambulatory  Ht _0  (1.778 m)  Wt 169 lb 12.1 oz (77 kg)  BMI 24.36 kg/m2  Filed Weights   03/07/15 0948  Weight: 169 lb 12.1 oz (77 kg)    GENERAL: Well-nourished well-developed; Alert, no distress and comfortable.   Accompanied by daughter. He has obvious bone feces.  EYES: Positive for pallor no icterus OROPHARYNX: no thrush or ulceration; good dentition  NECK: supple, no masses felt LYMPH:  no palpable lymphadenopathy in the cervical, axillary or inguinal regions LUNGS: clear to auscultation and  No wheeze or crackles HEART/CVS: regular rate & rhythm and no murmurs; 2+ bilatera lower extremity edema ABDOMEN:abdomen soft, non-tender and normal bowel sounds Musculoskeletal:no cyanosis of digits and no clubbing  PSYCH: alert & oriented x 3 with fluent speech NEURO: no focal motor/sensory deficits SKIN:  n multiple ecchymosis./Chronic  LABORATORY DATA:  I have reviewed the data as listed    Component Value Date/Time   NA 136 02/28/2015 1003   NA 140 10/06/2013 0949   K 3.6 02/28/2015 1003   K 3.7 10/06/2013 0949   CL 100* 02/28/2015 1003   CL 103 10/06/2013 0949   CO2 25 02/28/2015 1003   CO2 29 10/06/2013 0949   GLUCOSE 120* 02/28/2015 1003   GLUCOSE 116* 10/06/2013 0949   BUN 24* 02/28/2015 1003   BUN 13 10/06/2013 0949   CREATININE 0.89 02/28/2015 1003   CREATININE 0.83  04/07/2014 0929   CALCIUM 8.1* 02/28/2015 1003   CALCIUM 9.2 04/07/2014 0929   PROT 5.7* 02/28/2015 1003   PROT 6.6 04/07/2014 0929   ALBUMIN 3.4* 02/28/2015 1003   ALBUMIN 3.5 04/07/2014 0929   AST 24 02/28/2015 1003   AST 17 04/07/2014 0929   ALT 39 02/28/2015 1003   ALT 25 04/07/2014 0929   ALKPHOS 126 02/28/2015 1003   ALKPHOS 99 04/07/2014 0929   BILITOT 0.6 02/28/2015 1003   BILITOT 0.4 04/07/2014 0929   GFRNONAA >60 02/28/2015 1003   GFRNONAA >60 04/07/2014 0929   GFRNONAA >60 01/13/2014 4627  GFRAA >60 02/28/2015 1003   GFRAA >60 04/07/2014 0929   GFRAA >60 01/13/2014 0931    No results found for: SPEP, UPEP  Lab Results  Component Value Date   WBC 10.0 02/28/2015   NEUTROABS 4.5 02/14/2015   HGB 7.0* 02/28/2015   HCT 21.7* 02/28/2015   MCV 100.2* 02/28/2015   PLT 57* 02/28/2015      Chemistry      Component Value Date/Time   NA 136 02/28/2015 1003   NA 140 10/06/2013 0949   K 3.6 02/28/2015 1003   K 3.7 10/06/2013 0949   CL 100* 02/28/2015 1003   CL 103 10/06/2013 0949   CO2 25 02/28/2015 1003   CO2 29 10/06/2013 0949   BUN 24* 02/28/2015 1003   BUN 13 10/06/2013 0949   CREATININE 0.89 02/28/2015 1003   CREATININE 0.83 04/07/2014 0929      Component Value Date/Time   CALCIUM 8.1* 02/28/2015 1003   CALCIUM 9.2 04/07/2014 0929   ALKPHOS 126 02/28/2015 1003   ALKPHOS 99 04/07/2014 0929   AST 24 02/28/2015 1003   AST 17 04/07/2014 0929   ALT 39 02/28/2015 1003   ALT 25 04/07/2014 0929   BILITOT 0.6 02/28/2015 1003   BILITOT 0.4 04/07/2014 0929       RADIOGRAPHIC STUDIES: I have personally reviewed the radiological images as listed and agreed with the findings in the report. No results found.   ASSESSMENT & PLAN:   # METASTATIC PROSTATE CA -CASTRATE RESISTANT: Currently on fourth line therapy with Lupron q 35M /Taxotere q3W; status post cycle #3.   CT chest- shows improving/stable left upper lobe lung lesion/right adrenal metastases. I  reviewed the CT images myself reviewed the images with the patient and his daughter. Patient had improvement of the PSA from 935 in July 2016 to 801 September 2016 just after 2 cycles of chemotherapy.   # Proceed with cycle #4 of chemotherapy; however platelets were 53- recommend dose reducing the Taxotere to 66% [50 mg/m]. Also order a PSA in next 2 weeks.  # fluid retention-secondary to steroids; continue Lasix. Recommend to take Lasix up to 2 pills if needed.  # Cough/phlegm production- likely from COPD. No obvious evidence of CHF on the physical exam. Start patient on albuterol inhaler.  # Thrombocytopenia grade 2; Check CBC weekly. Patient is not on any antiplatelet therapy.  # Anemia secondary to chemotherapy- today hemoglobin 10 fairly asymptomatic.repeat CBC weekly basis.   #  Met from Prostate vs LUNG CA  [2016] s/p RT [Aug 2016; Dr.Crystal]- stable as per the recent CT scan November 2016.  # Mets to Bone: on X-geva SQ q 4W; no concerns for osteonecrosis of the jaw.   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    No orders of the defined types were placed in this encounter.   All questions were answered. The patient knows to call the clinic with any problems, questions or concerns. No barriers to learning was detected.  40 minutes face-to-face spent with the patient and family; more than 50% of time spent on counseling and coordination of above medical care.     Cammie Sickle, MD 03/07/2015 10:06 AM

## 2015-03-13 NOTE — Addendum Note (Signed)
Addended by: Renita Papa R on: 03/13/2015 11:17 AM   Modules accepted: Orders

## 2015-03-14 ENCOUNTER — Other Ambulatory Visit: Payer: Self-pay | Admitting: *Deleted

## 2015-03-14 ENCOUNTER — Inpatient Hospital Stay: Payer: Medicare Other

## 2015-03-14 DIAGNOSIS — Z5111 Encounter for antineoplastic chemotherapy: Secondary | ICD-10-CM | POA: Diagnosis not present

## 2015-03-14 DIAGNOSIS — C7951 Secondary malignant neoplasm of bone: Principal | ICD-10-CM

## 2015-03-14 DIAGNOSIS — C61 Malignant neoplasm of prostate: Secondary | ICD-10-CM

## 2015-03-14 DIAGNOSIS — J209 Acute bronchitis, unspecified: Secondary | ICD-10-CM

## 2015-03-14 DIAGNOSIS — IMO0001 Reserved for inherently not codable concepts without codable children: Secondary | ICD-10-CM

## 2015-03-14 LAB — CBC WITH DIFFERENTIAL/PLATELET
BASOS ABS: 0.1 10*3/uL (ref 0–0.1)
BASOS PCT: 0 %
EOS PCT: 0 %
Eosinophils Absolute: 0 10*3/uL (ref 0–0.7)
HEMATOCRIT: 25.8 % — AB (ref 40.0–52.0)
Hemoglobin: 8.3 g/dL — ABNORMAL LOW (ref 13.0–18.0)
LYMPHS ABS: 0.9 10*3/uL — AB (ref 1.0–3.6)
LYMPHS PCT: 4 %
MCH: 30.3 pg (ref 26.0–34.0)
MCHC: 32.3 g/dL (ref 32.0–36.0)
MCV: 94 fL (ref 80.0–100.0)
MONO ABS: 0.1 10*3/uL — AB (ref 0.2–1.0)
Monocytes Relative: 1 %
Neutro Abs: 19.4 10*3/uL — ABNORMAL HIGH (ref 1.4–6.5)
Neutrophils Relative %: 95 %
PLATELETS: 50 10*3/uL — AB (ref 150–440)
RBC: 2.74 MIL/uL — ABNORMAL LOW (ref 4.40–5.90)
RDW: 25.7 % — AB (ref 11.5–14.5)
WBC: 20.5 10*3/uL — ABNORMAL HIGH (ref 3.8–10.6)

## 2015-03-14 LAB — SAMPLE TO BLOOD BANK

## 2015-03-14 MED ORDER — LEVOFLOXACIN 500 MG PO TABS: 500.0000 mg | ORAL_TABLET | Freq: Every day | ORAL | Status: DC

## 2015-03-14 NOTE — Progress Notes (Signed)
Patient presented to clinic today for lab only. States that he is wheezing and coughing up brown sputum. He denies any fevers. He states, "It hurts to cough." Audible wheeze ausculted throughout all lung fields. patient has not yet picked up albuteral which was recently prescribed due to a $40 copay. He states that he will pick up the rx today at [facility] on [address]. Patient instructions on how to use albuterol. RN spoke with Dr. Rogue Bussing. V/o to send rx for Levaquin 500 mg once daily x 7 days.

## 2015-03-15 ENCOUNTER — Other Ambulatory Visit: Payer: Self-pay | Admitting: Internal Medicine

## 2015-03-21 ENCOUNTER — Inpatient Hospital Stay (HOSPITAL_BASED_OUTPATIENT_CLINIC_OR_DEPARTMENT_OTHER): Payer: Medicare Other | Admitting: Internal Medicine

## 2015-03-21 ENCOUNTER — Inpatient Hospital Stay: Payer: Medicare Other

## 2015-03-21 DIAGNOSIS — C7951 Secondary malignant neoplasm of bone: Secondary | ICD-10-CM

## 2015-03-21 DIAGNOSIS — IMO0001 Reserved for inherently not codable concepts without codable children: Secondary | ICD-10-CM

## 2015-03-21 DIAGNOSIS — Z7689 Persons encountering health services in other specified circumstances: Secondary | ICD-10-CM

## 2015-03-21 DIAGNOSIS — R6884 Jaw pain: Secondary | ICD-10-CM

## 2015-03-21 DIAGNOSIS — R609 Edema, unspecified: Secondary | ICD-10-CM

## 2015-03-21 DIAGNOSIS — M87180 Osteonecrosis due to drugs, jaw: Secondary | ICD-10-CM

## 2015-03-21 DIAGNOSIS — D696 Thrombocytopenia, unspecified: Secondary | ICD-10-CM | POA: Diagnosis not present

## 2015-03-21 DIAGNOSIS — C61 Malignant neoplasm of prostate: Secondary | ICD-10-CM

## 2015-03-21 DIAGNOSIS — Z87891 Personal history of nicotine dependence: Secondary | ICD-10-CM

## 2015-03-21 DIAGNOSIS — T451X5A Adverse effect of antineoplastic and immunosuppressive drugs, initial encounter: Secondary | ICD-10-CM

## 2015-03-21 DIAGNOSIS — D6481 Anemia due to antineoplastic chemotherapy: Secondary | ICD-10-CM | POA: Diagnosis not present

## 2015-03-21 DIAGNOSIS — Z79899 Other long term (current) drug therapy: Secondary | ICD-10-CM

## 2015-03-21 DIAGNOSIS — G629 Polyneuropathy, unspecified: Secondary | ICD-10-CM

## 2015-03-21 DIAGNOSIS — Z5111 Encounter for antineoplastic chemotherapy: Secondary | ICD-10-CM | POA: Diagnosis not present

## 2015-03-21 DIAGNOSIS — R05 Cough: Secondary | ICD-10-CM

## 2015-03-21 LAB — CBC WITH DIFFERENTIAL/PLATELET
Basophils Absolute: 0.1 10*3/uL (ref 0–0.1)
EOS ABS: 0 10*3/uL (ref 0–0.7)
Eosinophils Relative: 0 %
HCT: 23.7 % — ABNORMAL LOW (ref 40.0–52.0)
HEMOGLOBIN: 7.7 g/dL — AB (ref 13.0–18.0)
Lymphocytes Relative: 7 %
Lymphs Abs: 0.8 10*3/uL — ABNORMAL LOW (ref 1.0–3.6)
MCH: 30.8 pg (ref 26.0–34.0)
MCHC: 32.6 g/dL (ref 32.0–36.0)
MCV: 94.4 fL (ref 80.0–100.0)
Monocytes Absolute: 0.3 10*3/uL (ref 0.2–1.0)
Monocytes Relative: 3 %
Neutro Abs: 10.6 10*3/uL — ABNORMAL HIGH (ref 1.4–6.5)
Platelets: 49 10*3/uL — ABNORMAL LOW (ref 150–440)
RBC: 2.51 MIL/uL — AB (ref 4.40–5.90)
RDW: 26.9 % — ABNORMAL HIGH (ref 11.5–14.5)
WBC: 11.8 10*3/uL — AB (ref 3.8–10.6)

## 2015-03-21 LAB — PSA: PSA: 1298 ng/mL — ABNORMAL HIGH (ref 0.00–4.00)

## 2015-03-21 LAB — PREPARE RBC (CROSSMATCH)

## 2015-03-21 LAB — SAMPLE TO BLOOD BANK

## 2015-03-21 MED ORDER — AMOXICILLIN-POT CLAVULANATE 875-125 MG PO TABS: 1.0000 | ORAL_TABLET | Freq: Two times a day (BID) | ORAL | Status: DC

## 2015-03-21 NOTE — Progress Notes (Unsigned)
Patient here today as acute add on regarding left jaw edema.  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  He had surgery 2 years ago.  Also was switched to [place]. Presents today with edema of left jaw. No complaints of pain, just pressure.  Has tried to contact surgeon @ [facility] without success.  Patient states he just completed a round of Levaquin.

## 2015-03-21 NOTE — Progress Notes (Signed)
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] [2008- Dx Prostate ca Gleason 7]; STARTED DEC 2011-Zytiga + Pred; # X-Tandi; # Xofigo; July PSA 935 # Aug 2016- START TAXOTERE; PSA SEP 2016- 803; NOV 2016- dose reduced to 51m/m2  # OSTEONECROSIS of JAW- STOP X-geva [NOV 2016 she]   # July 2016- LUL 2-3 cm Hilar lesion x 2- Likely Mets from prostate Ca vs Primary Lung- Bx- Epitheloid [[facility] [employer] inconclusive] s/p RT Aug 2016; Dr.Crystal]; NOV 2016- CT LUL/R.Adrenal/bone mets- STABLE  INTERVAL HISTORY: A very pleasant 74year old male patient with above history of 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 He is status post 4 cycles of chemotherapy- approximately 2 weeks ago.  Patient today walks into the clinic complaining of pain in the jaw on the left side; associated with swelling. Denies any fevers. Denies any pus coming out.  He has been on recent Levaquin/for cough- improved. He denies any significant shortness of breath. He complains of leg swelling. Denies any fevers. He does complain of fatigue. No bleeding.   He has mild tingling and numbness in his feet. This is not interrupting his daily lifestyle.  REVIEW OF SYSTEMS:  A complete 10 point review of system is done which is negative except mentioned above/history of present illness.   PAST MEDICAL HISTORY :  Past Medical History  Diagnosis Date  . [place]  . Pneumonia   . Arthritis   . Anemia   . [place]   . Vertigo     PAST SURGICAL HISTORY :   Past Surgical History  Procedure Laterality Date  . Back surgery    . Cervical disc surgery    . Kidney stone    . Nasal septum surgery    . Endobronchial ultrasound N/A 11/22/2014   Procedure: ENDOBRONCHIAL ULTRASOUND;  Surgeon: VVilinda Boehringer MD;  Location: [facility] ORS;  Service: Cardiopulmonary;  Laterality: N/A;    FAMILY HISTORY :   Family History  Problem Relation Age of Onset  . Heart attack Father 391   death  . Diabetes Sister   . COPD Sister   . Emphysema Sister   . Heart Problems Sister   . Diabetes Brother   . Heart Problems Brother   . Liver cancer Brother     SOCIAL HISTORY:   Social History  Substance Use Topics  . Smoking status: Former Smoker -- 1.00 packs/day for 30 years    Types: Cigarettes  . Smokeless tobacco: Never Used  . Alcohol Use: 0.0 oz/week    0 Standard drinks or equivalent per week     Comment: occasional beer    ALLERGIES:  has No Known Allergies.  MEDICATIONS:  Current Outpatient Prescriptions  Medication Sig Dispense Refill  . albuterol (PROVENTIL HFA;VENTOLIN HFA) 108 (90 BASE) MCG/ACT inhaler Inhale 2 puffs into the lungs every 6 (six) hours as needed for wheezing or shortness of breath. Or with cough 1 Inhaler 2  . calcium carbonate (OSCAL) 1500 (600 CA) MG TABS tablet Take 600 mg of elemental calcium by mouth 2 (two) times daily with a meal.    . chlorhexidine (PERIDEX) 0.12 % solution 1 mL by Mouth Rinse route at bedtime.    .Marland Kitchendexamethasone (DECADRON) 4 MG tablet Take 2  tablets twice a day the day before chemo, the day of chemo and day after chemo with each treatment 72 tablet 0  . doxazosin (CARDURA) 2 MG tablet Take 2 mg by mouth at bedtime.     . ferrous sulfate 325 (65 FE) MG tablet Take 325 mg by mouth 2 (two) times daily with a meal.    . furosemide (LASIX) 20 MG tablet Once a day as needed Or if leg swelling significantly worse- can take up to 2 pills a day 90 tablet 0  . leuprolide (LUPRON) 11.25 MG KIT injection Inject 1 each into the muscle every 3 (three) months.    . meclizine (ANTIVERT) 25 MG tablet TAKE ONE TABLET BY MOUTH THREE TIMES DAILY AS NEEDED FOR DIZZINESS 60 tablet 0  . Multiple Vitamin  (MULTI-VITAMINS) TABS Take 1 tablet by mouth daily.    . naproxen (NAPROSYN) 500 MG tablet Take 500 mg by mouth 2 (two) times daily with a meal.    . oxyCODONE (OXY IR/ROXICODONE) 5 MG immediate release tablet Take 1 tablet (5 mg total) by mouth 5 (five) times daily as needed for severe pain. 150 tablet 0  . predniSONE (DELTASONE) 5 MG tablet Take 1 tablet (5 mg total) by mouth 2 (two) times daily. 60 tablet 3  . sucralfate (CARAFATE) 1 GM/10ML suspension Take 10 mLs (1 g total) by mouth 4 (four) times daily -  with meals and at bedtime. 420 mL 1   No current facility-administered medications for this visit.   Facility-Administered Medications Ordered in Other Visits  Medication Dose Route Frequency Provider Last Rate Last Dose  . Influenza vac split quadrivalent PF (FLUARIX) injection 0.5 mL  0.5 mL Intramuscular Once Leia Alf, MD        PHYSICAL EXAMINATION: ECOG PERFORMANCE STATUS: 1 - Symptomatic but completely ambulatory  There were no vitals taken for this visit.  There were no vitals filed for this visit.  GENERAL: Well-nourished well-developed; Alert, no distress and comfortable.   Alone. Obvious swelling on the left side of the jaw noted EYES: Positive for pallor no icterus OROPHARYNX: no thrush or ulceration; exposed bone noted in the left lower jaw. NECK: supple, no masses felt LYMPH:  no palpable lymphadenopathy in the cervical, axillary or inguinal regions LUNGS: clear to auscultation and  No wheeze or crackles HEART/CVS: regular rate & rhythm and no murmurs; 2+ bilatera lower extremity edema ABDOMEN:abdomen soft, non-tender and normal bowel sounds Musculoskeletal:no cyanosis of digits and no clubbing  PSYCH: alert & oriented x 3 with fluent speech NEURO: no focal motor/sensory deficits SKIN:  n multiple ecchymosis./Chronic  LABORATORY DATA:  I have reviewed the data as listed    Component Value Date/Time   NA 136 02/28/2015 1003   NA 140 10/06/2013 0949   K  3.6 02/28/2015 1003   K 3.7 10/06/2013 0949   CL 100* 02/28/2015 1003   CL 103 10/06/2013 0949   CO2 25 02/28/2015 1003   CO2 29 10/06/2013 0949   GLUCOSE 120* 02/28/2015 1003   GLUCOSE 116* 10/06/2013 0949   BUN 24* 02/28/2015 1003   BUN 13 10/06/2013 0949   CREATININE 0.89 02/28/2015 1003   CREATININE 0.83 04/07/2014 0929   CALCIUM 8.1* 02/28/2015 1003   CALCIUM 9.2 04/07/2014 0929   PROT 5.7* 02/28/2015 1003   PROT 6.6 04/07/2014 0929   ALBUMIN 3.4* 02/28/2015 1003   ALBUMIN 3.5 04/07/2014 0929   AST 24 02/28/2015 1003   AST 17 04/07/2014 0929   ALT  39 02/28/2015 1003   ALT 25 04/07/2014 0929   ALKPHOS 126 02/28/2015 1003   ALKPHOS 99 04/07/2014 0929   BILITOT 0.6 02/28/2015 1003   BILITOT 0.4 04/07/2014 0929   GFRNONAA >60 02/28/2015 1003   GFRNONAA >60 04/07/2014 0929   GFRNONAA >60 01/13/2014 0931   GFRAA >60 02/28/2015 1003   GFRAA >60 04/07/2014 0929   GFRAA >60 01/13/2014 0931    No results found for: SPEP, UPEP  Lab Results  Component Value Date   WBC 11.8* 03/21/2015   NEUTROABS 10.6* 03/21/2015   HGB 7.7* 03/21/2015   HCT 23.7* 03/21/2015   MCV 94.4 03/21/2015   PLT 49* 03/21/2015      Chemistry      Component Value Date/Time   NA 136 02/28/2015 1003   NA 140 10/06/2013 0949   K 3.6 02/28/2015 1003   K 3.7 10/06/2013 0949   CL 100* 02/28/2015 1003   CL 103 10/06/2013 0949   CO2 25 02/28/2015 1003   CO2 29 10/06/2013 0949   BUN 24* 02/28/2015 1003   BUN 13 10/06/2013 0949   CREATININE 0.89 02/28/2015 1003   CREATININE 0.83 04/07/2014 0929      Component Value Date/Time   CALCIUM 8.1* 02/28/2015 1003   CALCIUM 9.2 04/07/2014 0929   ALKPHOS 126 02/28/2015 1003   ALKPHOS 99 04/07/2014 0929   AST 24 02/28/2015 1003   AST 17 04/07/2014 0929   ALT 39 02/28/2015 1003   ALT 25 04/07/2014 0929   BILITOT 0.6 02/28/2015 1003   BILITOT 0.4 04/07/2014 0929       RADIOGRAPHIC STUDIES: I have personally reviewed the radiological images as  listed and agreed with the findings in the report. No results found.   ASSESSMENT & PLAN:   # METASTATIC PROSTATE CA -CASTRATE RESISTANT: Currently on fourth line therapy with Lupron q 4M /Taxotere q3W; status post cycle #4. Partial response after 3 cycles. PSA pending.  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 Discontinue denosumab. Start Augmentin. I left a message for patient's oral surgeon Dr. Patrina Levering at [facility] phone number [phone]; to see if patient could see Dr. Patrina Levering fairly soon.  # fluid retention-secondary to steroids; continue Lasix. Recommend to take Lasix up to 2 pills if needed.  # Cough/phlegm production- likely from COPD. No obvious evidence of CHF on the physical exam.  # Thrombocytopenia grade 3-platelets 40s. Check CBC weekly. Patient is not on any antiplatelet therapy.  # Anemia secondary to chemotherapy- today hemoglobin is 7.8; recommend 2 units of PRBC transfusion.   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 Stable.    No orders of the defined types were placed in this encounter.   All questions were answered. The patient knows to call the clinic with any problems, questions or concerns. No barriers to learning was detected.      Cammie Sickle, MD 03/21/2015 10:09 AM

## 2015-03-22 ENCOUNTER — Inpatient Hospital Stay: Payer: Medicare Other

## 2015-03-22 VITALS — BP 139/72 | HR 76 | Temp 97.2°F | Resp 20

## 2015-03-22 DIAGNOSIS — Z5111 Encounter for antineoplastic chemotherapy: Secondary | ICD-10-CM | POA: Diagnosis not present

## 2015-03-22 DIAGNOSIS — T451X5A Adverse effect of antineoplastic and immunosuppressive drugs, initial encounter: Principal | ICD-10-CM

## 2015-03-22 DIAGNOSIS — D6481 Anemia due to antineoplastic chemotherapy: Secondary | ICD-10-CM

## 2015-03-22 MED ORDER — DIPHENHYDRAMINE HCL 25 MG PO CAPS
25.0000 mg | ORAL_CAPSULE | Freq: Once | ORAL | Status: AC
  Administered 2015-03-22: 25 mg via ORAL

## 2015-03-22 MED ORDER — SODIUM CHLORIDE 0.9 % IV SOLN
250.0000 mL | Freq: Once | INTRAVENOUS | Status: AC
  Administered 2015-03-22: 250 mL via INTRAVENOUS
  Filled 2015-03-22: qty 250

## 2015-03-22 MED ORDER — FUROSEMIDE 10 MG/ML IJ SOLN
20.0000 mg | Freq: Once | INTRAMUSCULAR | Status: AC
  Administered 2015-03-22: 20 mg via INTRAVENOUS

## 2015-03-22 MED ORDER — ACETAMINOPHEN 325 MG PO TABS
650.0000 mg | ORAL_TABLET | Freq: Once | ORAL | Status: AC
  Administered 2015-03-22: 650 mg via ORAL

## 2015-03-23 LAB — TYPE AND SCREEN
ABO/RH(D): O NEG
Antibody Screen: NEGATIVE
UNIT DIVISION: 0
Unit division: 0

## 2015-03-28 ENCOUNTER — Inpatient Hospital Stay: Payer: Medicare Other

## 2015-03-28 ENCOUNTER — Inpatient Hospital Stay (HOSPITAL_BASED_OUTPATIENT_CLINIC_OR_DEPARTMENT_OTHER): Payer: Medicare Other | Admitting: Internal Medicine

## 2015-03-28 ENCOUNTER — Encounter: Payer: Self-pay | Admitting: Internal Medicine

## 2015-03-28 VITALS — BP 129/82 | HR 108 | Temp 97.8°F | Resp 22 | Ht 70.0 in | Wt 164.5 lb

## 2015-03-28 DIAGNOSIS — C7951 Secondary malignant neoplasm of bone: Secondary | ICD-10-CM

## 2015-03-28 DIAGNOSIS — IMO0001 Reserved for inherently not codable concepts without codable children: Secondary | ICD-10-CM

## 2015-03-28 DIAGNOSIS — R5383 Other fatigue: Secondary | ICD-10-CM

## 2015-03-28 DIAGNOSIS — R059 Cough, unspecified: Secondary | ICD-10-CM

## 2015-03-28 DIAGNOSIS — T451X5A Adverse effect of antineoplastic and immunosuppressive drugs, initial encounter: Secondary | ICD-10-CM

## 2015-03-28 DIAGNOSIS — C61 Malignant neoplasm of prostate: Secondary | ICD-10-CM

## 2015-03-28 DIAGNOSIS — G629 Polyneuropathy, unspecified: Secondary | ICD-10-CM

## 2015-03-28 DIAGNOSIS — D696 Thrombocytopenia, unspecified: Secondary | ICD-10-CM | POA: Diagnosis not present

## 2015-03-28 DIAGNOSIS — Z79899 Other long term (current) drug therapy: Secondary | ICD-10-CM

## 2015-03-28 DIAGNOSIS — D6481 Anemia due to antineoplastic chemotherapy: Secondary | ICD-10-CM | POA: Diagnosis not present

## 2015-03-28 DIAGNOSIS — Z5111 Encounter for antineoplastic chemotherapy: Secondary | ICD-10-CM | POA: Diagnosis not present

## 2015-03-28 DIAGNOSIS — D649 Anemia, unspecified: Secondary | ICD-10-CM

## 2015-03-28 DIAGNOSIS — M8788 Other osteonecrosis, other site: Secondary | ICD-10-CM

## 2015-03-28 DIAGNOSIS — R05 Cough: Secondary | ICD-10-CM

## 2015-03-28 LAB — COMPREHENSIVE METABOLIC PANEL
ALBUMIN: 3.4 g/dL — AB (ref 3.5–5.0)
ALK PHOS: 131 U/L — AB (ref 38–126)
ALT: 33 U/L (ref 17–63)
ANION GAP: 16 — AB (ref 5–15)
AST: 48 U/L — ABNORMAL HIGH (ref 15–41)
BILIRUBIN TOTAL: 0.5 mg/dL (ref 0.3–1.2)
BUN: 14 mg/dL (ref 6–20)
CALCIUM: 8.9 mg/dL (ref 8.9–10.3)
CO2: 21 mmol/L — ABNORMAL LOW (ref 22–32)
Chloride: 100 mmol/L — ABNORMAL LOW (ref 101–111)
Creatinine, Ser: 0.89 mg/dL (ref 0.61–1.24)
GLUCOSE: 263 mg/dL — AB (ref 65–99)
Potassium: 3.3 mmol/L — ABNORMAL LOW (ref 3.5–5.1)
Sodium: 137 mmol/L (ref 135–145)
TOTAL PROTEIN: 6.4 g/dL — AB (ref 6.5–8.1)

## 2015-03-28 LAB — SAMPLE TO BLOOD BANK

## 2015-03-28 LAB — CBC WITH DIFFERENTIAL/PLATELET
BASOS ABS: 0 10*3/uL (ref 0–0.1)
Eosinophils Absolute: 0 10*3/uL (ref 0–0.7)
Eosinophils Relative: 0 %
HEMATOCRIT: 29.7 % — AB (ref 40.0–52.0)
Hemoglobin: 9.9 g/dL — ABNORMAL LOW (ref 13.0–18.0)
Lymphocytes Relative: 5 %
Lymphs Abs: 0.5 10*3/uL — ABNORMAL LOW (ref 1.0–3.6)
MCH: 29.7 pg (ref 26.0–34.0)
MCHC: 33.4 g/dL (ref 32.0–36.0)
MCV: 89 fL (ref 80.0–100.0)
MONO ABS: 0.3 10*3/uL (ref 0.2–1.0)
Monocytes Relative: 3 %
NEUTROS ABS: 9.4 10*3/uL — AB (ref 1.4–6.5)
Neutrophils Relative %: 92 %
Platelets: 41 10*3/uL — ABNORMAL LOW (ref 150–440)
RBC: 3.34 MIL/uL — AB (ref 4.40–5.90)
RDW: 25.6 % — ABNORMAL HIGH (ref 11.5–14.5)
WBC: 10.1 10*3/uL (ref 3.8–10.6)

## 2015-03-28 MED ORDER — NAPROXEN 500 MG PO TABS: 500.0000 mg | ORAL_TABLET | Freq: Two times a day (BID) | ORAL | Status: AC

## 2015-03-28 NOTE — Progress Notes (Signed)
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] [2008- Dx Prostate ca Gleason 7]; STARTED DEC 2011-Zytiga + Pred; # X-Tandi; # Xofigo; July PSA 935 # Aug 2016- START TAXOTERE; PSA SEP 2016- 803; NOV 2016- dose reduced to 31m/m2  # OSTEONECROSIS of JAW- STOP XJayme Cloud[NOV 2016 ]   # July 2016- LUL 2-3 cm Hilar lesion x 2- Likely Mets from prostate Ca vs Primary Lung- Bx- Epitheloid [Dustin Frazier inconclusive] s/p RT Aug 2016; Dr.Crystal]; NOV 2016- CT LUL/R.Adrenal/bone mets- STABLE  INTERVAL HISTORY: A very pleasant 74year old male patient with above history of 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 He is status post 4 cycles of chemotherapy- approximately 3 weeks ago. Patient is here to review the results of this lab/proceed with further chemotherapy.  In the interim patient was diagnosed with osteonecrosis of the jaw- he has [facility] He has been started on amoxicillin and Flagyl.  Patient states his jaw pain is improved. However he continues to complain of worsening pain in this knees. He also complains of extreme fatigue. And shortness of breath on exertion. Fatigue slightly improved post blood transfusion last week.  He denies any unusual cough. He has intermittent swelling in the legs. Intermittent tingling and numbness in his feet.   REVIEW OF SYSTEMS:  A complete 10 point review of system is done which is negative except mentioned above/history of present illness.   PAST MEDICAL HISTORY :  Past Medical History  Diagnosis Date  . [place] 04[place]  . Pneumonia   . Arthritis   . Anemia   . Thrombocytopenia ([place]   . Vertigo     PAST SURGICAL HISTORY :   Past Surgical History  Procedure  Laterality Date  . Back surgery    . Cervical disc surgery    . Kidney stone    . Nasal septum surgery    . Endobronchial ultrasound N/A 11/22/2014    Procedure: ENDOBRONCHIAL ULTRASOUND;  Surgeon: VVilinda Boehringer MD;  Location: [facility] ORS;  Service: Cardiopulmonary;  Laterality: N/A;    FAMILY HISTORY :   Family History  Problem Relation Age of Onset  . Heart attack Father 357   death  . Diabetes Sister   . COPD Sister   . Emphysema Sister   . Heart Problems Sister   . Diabetes Brother   . Heart Problems Brother   . Liver cancer Brother     SOCIAL HISTORY:   Social History  Substance Use Topics  . Smoking status: Former Smoker -- 1.00 packs/day for 30 years    Types: Cigarettes  . Smokeless tobacco: Never Used  . Alcohol Use: 0.0 oz/week    0 Standard drinks or equivalent per week     Comment: occasional beer    ALLERGIES:  has No Known Allergies.  MEDICATIONS:  Current Outpatient Prescriptions  Medication Sig Dispense Refill  . albuterol (PROVENTIL HFA;VENTOLIN HFA) 108 (90 BASE) MCG/ACT inhaler Inhale 2 puffs into the lungs every 6 (six) hours as needed for wheezing or shortness of breath. Or with cough 1 Inhaler 2  . amoxicillin (AMOXIL) 500 MG capsule Take 1 capsule by mouth 3 (three) times daily.    . calcium carbonate (OSCAL) 1500 (600 CA) MG TABS tablet  Take 600 mg of elemental calcium by mouth 2 (two) times daily with a meal.    . chlorhexidine (PERIDEX) 0.12 % solution 1 mL by Mouth Rinse route at bedtime.    Dustin Frazier dexamethasone (DECADRON) 4 MG tablet Take 2 tablets twice a day the day before chemo, the day of chemo and day after chemo with each treatment 72 tablet 0  . doxazosin (CARDURA) 2 MG tablet Take 2 mg by mouth at bedtime.     . ferrous sulfate 325 (65 FE) MG tablet Take 325 mg by mouth 2 (two) times daily with a meal.    . furosemide (LASIX) 20 MG tablet Once a day as needed Or if leg swelling significantly worse- can take up to 2 pills a day 90 tablet 0  .  leuprolide (LUPRON) 11.25 MG KIT injection Inject 1 each into the muscle every 3 (three) months.    . meclizine (ANTIVERT) 25 MG tablet TAKE ONE TABLET BY MOUTH THREE TIMES DAILY AS NEEDED FOR DIZZINESS 60 tablet 0  . metroNIDAZOLE (FLAGYL) 500 MG tablet Take 1 tablet by mouth daily.    . Multiple Vitamin (MULTI-VITAMINS) TABS Take 1 tablet by mouth daily.    . naproxen (NAPROSYN) 500 MG tablet Take 500 mg by mouth 2 (two) times daily with a meal.    . oxyCODONE (OXY IR/ROXICODONE) 5 MG immediate release tablet Take 1 tablet (5 mg total) by mouth 5 (five) times daily as needed for severe pain. 150 tablet 0  . predniSONE (DELTASONE) 5 MG tablet Take 1 tablet (5 mg total) by mouth 2 (two) times daily. 60 tablet 3  . sucralfate (CARAFATE) 1 GM/10ML suspension Take 10 mLs (1 g total) by mouth 4 (four) times daily -  with meals and at bedtime. 420 mL 1   No current facility-administered medications for this visit.   Facility-Administered Medications Ordered in Other Visits  Medication Dose Route Frequency Provider Last Rate Last Dose  . Influenza vac split quadrivalent PF (FLUARIX) injection 0.5 mL  0.5 mL Intramuscular Once Leia Alf, MD        PHYSICAL EXAMINATION: ECOG PERFORMANCE STATUS: 2 - Symptomatic, <50% confined to bed  BP 129/82 mmHg  Pulse 108  Temp(Src) 97.8 F (36.6 C) (Tympanic)  Resp 22  Ht 5' 10"  (1.778 m)  Wt 164 lb 7.4 oz (74.6 kg)  BMI 23.60 kg/m2  Filed Weights   03/28/15 0848  Weight: 164 lb 7.4 oz (74.6 kg)    GENERAL: Well-nourished well-developed; Alert, no distress and comfortable.  He is accompanied by his daughter. Swelling in the left jaw improved. EYES: Positive for pallor no icterus OROPHARYNX: no thrush or ulceration; exposed bone noted in the left lower jaw. NECK: supple, no masses felt LYMPH:  no palpable lymphadenopathy in the cervical, axillary or inguinal regions LUNGS: clear to auscultation and  No wheeze or crackles HEART/CVS: regular rate  & rhythm and no murmurs; 1+ bilatera lower extremity edema ABDOMEN:abdomen soft, non-tender and normal bowel sounds Musculoskeletal:no cyanosis of digits and no clubbing  PSYCH: alert & oriented x 3 with fluent speech NEURO: no focal motor/sensory deficits SKIN:   multiple ecchymosis./Chronic  LABORATORY DATA:  I have reviewed the data as listed    Component Value Date/Time   NA 136 02/28/2015 1003   NA 140 10/06/2013 0949   K 3.6 02/28/2015 1003   K 3.7 10/06/2013 0949   CL 100* 02/28/2015 1003   CL 103 10/06/2013 0949   CO2 25 02/28/2015 1003  CO2 29 10/06/2013 0949   GLUCOSE 120* 02/28/2015 1003   GLUCOSE 116* 10/06/2013 0949   BUN 24* 02/28/2015 1003   BUN 13 10/06/2013 0949   CREATININE 0.89 02/28/2015 1003   CREATININE 0.83 04/07/2014 0929   CALCIUM 8.1* 02/28/2015 1003   CALCIUM 9.2 04/07/2014 0929   PROT 5.7* 02/28/2015 1003   PROT 6.6 04/07/2014 0929   ALBUMIN 3.4* 02/28/2015 1003   ALBUMIN 3.5 04/07/2014 0929   AST 24 02/28/2015 1003   AST 17 04/07/2014 0929   ALT 39 02/28/2015 1003   ALT 25 04/07/2014 0929   ALKPHOS 126 02/28/2015 1003   ALKPHOS 99 04/07/2014 0929   BILITOT 0.6 02/28/2015 1003   BILITOT 0.4 04/07/2014 0929   GFRNONAA >60 02/28/2015 1003   GFRNONAA >60 04/07/2014 0929   GFRNONAA >60 01/13/2014 0931   GFRAA >60 02/28/2015 1003   GFRAA >60 04/07/2014 0929   GFRAA >60 01/13/2014 0931    No results found for: SPEP, UPEP  Lab Results  Component Value Date   WBC 11.8* 03/21/2015   NEUTROABS 10.6* 03/21/2015   HGB 7.7* 03/21/2015   HCT 23.7* 03/21/2015   MCV 94.4 03/21/2015   PLT 49* 03/21/2015      Chemistry      Component Value Date/Time   NA 136 02/28/2015 1003   NA 140 10/06/2013 0949   K 3.6 02/28/2015 1003   K 3.7 10/06/2013 0949   CL 100* 02/28/2015 1003   CL 103 10/06/2013 0949   CO2 25 02/28/2015 1003   CO2 29 10/06/2013 0949   BUN 24* 02/28/2015 1003   BUN 13 10/06/2013 0949   CREATININE 0.89 02/28/2015 1003    CREATININE 0.83 04/07/2014 0929      Component Value Date/Time   CALCIUM 8.1* 02/28/2015 1003   CALCIUM 9.2 04/07/2014 0929   ALKPHOS 126 02/28/2015 1003   ALKPHOS 99 04/07/2014 0929   AST 24 02/28/2015 1003   AST 17 04/07/2014 0929   ALT 39 02/28/2015 1003   ALT 25 04/07/2014 0929   BILITOT 0.6 02/28/2015 1003   BILITOT 0.4 04/07/2014 0929       RADIOGRAPHIC STUDIES:   ASSESSMENT & PLAN:   # METASTATIC PROSTATE CA -CASTRATE RESISTANT: Currently on fourth line therapy with Lupron q 47M /Taxotere q3W; status post cycle #4. PSA after 4 cycles- has gone up to 1298 from a nadir of 800 in September 2016.  # Unfortunately this is highly concerning for progression of disease on current therapy with Taxotere. I recommend holding Taxotere today; especially since platelets 41. I recommend a CT of the abdomen and pelvis; and also bone scan for further evaluation. I had a long discussion the patient and his daughter regarding my concerns for progression of disease- which is very concerning.   # I do not think patient is a good candidate for clinical trials given his borderline performance status/compromised bone marrow function- which might improve with holding chemotherapy. # I discuss option of using cabazitaxel as the next line of chemotherapy. I did discuss the potential side effects including diarrhea; neutropenia which might accompany the chemotherapy.#  I also discussed about hospice. We have not made any decisions yet.   # Thrombocytopenia grade 3-platelets 40s. Check CBC weekly. Patient is not on any antiplatelet therapy.  # Anemia secondary to chemotherapy- today hemoglobin is 9.9; patient is status post 2 units of PRBC transfusion last week  # osteonecrosis of the jaw- amoxicillin Flagyl with maxillofacial surgery at [facility]. Patient has again  appointment tomorrow.  # Extreme fatigue- from chemotherapy.  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 Stable.   # Patient will follow-up  with me next week; bone scan; CT scan; CBC CMP.   All questions were answered. The patient knows to call the clinic with any problems, questions or concerns. No barriers to learning was detected.      Cammie Sickle, MD 03/28/2015 8:55 AM

## 2015-03-30 ENCOUNTER — Encounter
Admission: RE | Admit: 2015-03-30 | Discharge: 2015-03-30 | Disposition: A | Discharge status: Home / Self Care | Payer: Medicare Other | Source: Ambulatory Visit | Attending: Internal Medicine | Admitting: Internal Medicine

## 2015-03-30 DIAGNOSIS — C7951 Secondary malignant neoplasm of bone: Secondary | ICD-10-CM | POA: Insufficient documentation

## 2015-03-30 DIAGNOSIS — R059 Cough, unspecified: Secondary | ICD-10-CM

## 2015-03-30 DIAGNOSIS — R05 Cough: Secondary | ICD-10-CM

## 2015-03-30 DIAGNOSIS — C61 Malignant neoplasm of prostate: Secondary | ICD-10-CM | POA: Insufficient documentation

## 2015-03-30 DIAGNOSIS — D649 Anemia, unspecified: Secondary | ICD-10-CM

## 2015-03-30 MED ORDER — TECHNETIUM TC 99M MEDRONATE IV KIT
25.0000 | PACK | Freq: Once | INTRAVENOUS | Status: AC | PRN
  Administered 2015-03-30: 22.23 via INTRAVENOUS

## 2015-04-02 ENCOUNTER — Encounter: Payer: Self-pay | Admitting: Emergency Medicine

## 2015-04-02 ENCOUNTER — Emergency Department: Payer: Medicare Other

## 2015-04-02 ENCOUNTER — Inpatient Hospital Stay
Admission: EM | Admit: 2015-04-02 | Discharge: 2015-04-30 | DRG: 682 | Disposition: E | Payer: Medicare Other | Attending: Internal Medicine | Admitting: Internal Medicine

## 2015-04-02 ENCOUNTER — Other Ambulatory Visit: Payer: Self-pay

## 2015-04-02 ENCOUNTER — Inpatient Hospital Stay: Payer: Medicare Other

## 2015-04-02 DIAGNOSIS — Z515 Encounter for palliative care: Secondary | ICD-10-CM | POA: Diagnosis present

## 2015-04-02 DIAGNOSIS — E8770 Fluid overload, unspecified: Secondary | ICD-10-CM | POA: Diagnosis present

## 2015-04-02 DIAGNOSIS — J441 Chronic obstructive pulmonary disease with (acute) exacerbation: Secondary | ICD-10-CM | POA: Diagnosis present

## 2015-04-02 DIAGNOSIS — E875 Hyperkalemia: Secondary | ICD-10-CM | POA: Diagnosis present

## 2015-04-02 DIAGNOSIS — R000 Tachycardia, unspecified: Secondary | ICD-10-CM | POA: Diagnosis not present

## 2015-04-02 DIAGNOSIS — E871 Hypo-osmolality and hyponatremia: Secondary | ICD-10-CM | POA: Diagnosis present

## 2015-04-02 DIAGNOSIS — Z825 Family history of asthma and other chronic lower respiratory diseases: Secondary | ICD-10-CM | POA: Diagnosis not present

## 2015-04-02 DIAGNOSIS — T451X5A Adverse effect of antineoplastic and immunosuppressive drugs, initial encounter: Secondary | ICD-10-CM | POA: Diagnosis present

## 2015-04-02 DIAGNOSIS — D692 Other nonthrombocytopenic purpura: Secondary | ICD-10-CM | POA: Diagnosis present

## 2015-04-02 DIAGNOSIS — Z79899 Other long term (current) drug therapy: Secondary | ICD-10-CM | POA: Diagnosis not present

## 2015-04-02 DIAGNOSIS — C61 Malignant neoplasm of prostate: Secondary | ICD-10-CM | POA: Diagnosis present

## 2015-04-02 DIAGNOSIS — Z9889 Other specified postprocedural states: Secondary | ICD-10-CM

## 2015-04-02 DIAGNOSIS — Z87891 Personal history of nicotine dependence: Secondary | ICD-10-CM | POA: Diagnosis not present

## 2015-04-02 DIAGNOSIS — E872 Acidosis, unspecified: Secondary | ICD-10-CM | POA: Insufficient documentation

## 2015-04-02 DIAGNOSIS — D649 Anemia, unspecified: Secondary | ICD-10-CM

## 2015-04-02 DIAGNOSIS — R531 Weakness: Secondary | ICD-10-CM

## 2015-04-02 DIAGNOSIS — C7951 Secondary malignant neoplasm of bone: Secondary | ICD-10-CM | POA: Diagnosis present

## 2015-04-02 DIAGNOSIS — R34 Anuria and oliguria: Secondary | ICD-10-CM | POA: Diagnosis present

## 2015-04-02 DIAGNOSIS — J9600 Acute respiratory failure, unspecified whether with hypoxia or hypercapnia: Secondary | ICD-10-CM | POA: Diagnosis present

## 2015-04-02 DIAGNOSIS — D696 Thrombocytopenia, unspecified: Secondary | ICD-10-CM

## 2015-04-02 DIAGNOSIS — R7989 Other specified abnormal findings of blood chemistry: Secondary | ICD-10-CM | POA: Diagnosis present

## 2015-04-02 DIAGNOSIS — R6521 Severe sepsis with septic shock: Secondary | ICD-10-CM

## 2015-04-02 DIAGNOSIS — Z833 Family history of diabetes mellitus: Secondary | ICD-10-CM

## 2015-04-02 DIAGNOSIS — N179 Acute kidney failure, unspecified: Secondary | ICD-10-CM | POA: Diagnosis present

## 2015-04-02 DIAGNOSIS — I10 Essential (primary) hypertension: Secondary | ICD-10-CM | POA: Diagnosis present

## 2015-04-02 DIAGNOSIS — J9601 Acute respiratory failure with hypoxia: Secondary | ICD-10-CM | POA: Diagnosis not present

## 2015-04-02 DIAGNOSIS — N400 Enlarged prostate without lower urinary tract symptoms: Secondary | ICD-10-CM | POA: Diagnosis present

## 2015-04-02 DIAGNOSIS — Z66 Do not resuscitate: Secondary | ICD-10-CM | POA: Diagnosis present

## 2015-04-02 DIAGNOSIS — Z8249 Family history of ischemic heart disease and other diseases of the circulatory system: Secondary | ICD-10-CM

## 2015-04-02 DIAGNOSIS — M1990 Unspecified osteoarthritis, unspecified site: Secondary | ICD-10-CM | POA: Diagnosis present

## 2015-04-02 DIAGNOSIS — Z800 Family history of malignant neoplasm of digestive organs: Secondary | ICD-10-CM | POA: Diagnosis not present

## 2015-04-02 DIAGNOSIS — A419 Sepsis, unspecified organism: Secondary | ICD-10-CM | POA: Insufficient documentation

## 2015-04-02 LAB — BLOOD GAS, ARTERIAL
ALLENS TEST (PASS/FAIL): POSITIVE — AB
Acid-base deficit: 10.9 mmol/L — ABNORMAL HIGH (ref 0.0–2.0)
Acid-base deficit: 13.9 mmol/L — ABNORMAL HIGH (ref 0.0–2.0)
Allens test (pass/fail): POSITIVE — AB
BICARBONATE: 12.1 meq/L — AB (ref 21.0–28.0)
BICARBONATE: 9.8 meq/L — AB (ref 21.0–28.0)
Delivery systems: POSITIVE
EXPIRATORY PAP: 5
FIO2: 0.5
FIO2: 0.28
INSPIRATORY PAP: 10
Mechanical Rate: 10
O2 SAT: 99.3 %
O2 SAT: 99 %
PATIENT TEMPERATURE: 37
PATIENT TEMPERATURE: 37
PH ART: 7.32 — AB (ref 7.350–7.450)
PO2 ART: 150 mmHg — AB (ref 83.0–108.0)
PO2 ART: 144 mmHg — AB (ref 83.0–108.0)
pCO2 arterial: 21 mmHg — ABNORMAL LOW (ref 32.0–48.0)
pCO2 arterial: 19 mmHg — CL (ref 32.0–48.0)
pH, Arterial: 7.37 (ref 7.350–7.450)

## 2015-04-02 LAB — CBC WITH DIFFERENTIAL/PLATELET
BAND NEUTROPHILS: 14 %
BASOS PCT: 0 %
Basophils Absolute: 0 10*3/uL (ref 0–0.1)
Blasts: 0 %
EOS ABS: 0.1 10*3/uL (ref 0–0.7)
EOS PCT: 1 %
HCT: 15.3 % — ABNORMAL LOW (ref 40.0–52.0)
HEMOGLOBIN: 5.2 g/dL — AB (ref 13.0–18.0)
LYMPHS ABS: 0.7 10*3/uL — AB (ref 1.0–3.6)
LYMPHS PCT: 7 %
MCH: 30.6 pg (ref 26.0–34.0)
MCHC: 34.2 g/dL (ref 32.0–36.0)
MCV: 89.5 fL (ref 80.0–100.0)
MONO ABS: 0.7 10*3/uL (ref 0.2–1.0)
MONOS PCT: 7 %
Metamyelocytes Relative: 0 %
Myelocytes: 0 %
NEUTROS ABS: 9 10*3/uL — AB (ref 1.4–6.5)
NEUTROS PCT: 71 %
NRBC: 1 /100{WBCs} — AB
OTHER: 0 %
PROMYELOCYTES ABS: 0 %
Platelets: 19 10*3/uL — CL (ref 150–440)
RBC: 1.71 MIL/uL — ABNORMAL LOW (ref 4.40–5.90)
RDW: 26.1 % — AB (ref 11.5–14.5)
WBC: 10.5 10*3/uL (ref 3.8–10.6)

## 2015-04-02 LAB — URINALYSIS COMPLETE WITH MICROSCOPIC (ARMC ONLY)
BILIRUBIN URINE: NEGATIVE
GLUCOSE, UA: 50 mg/dL — AB
KETONES UR: NEGATIVE mg/dL
LEUKOCYTES UA: NEGATIVE
Nitrite: NEGATIVE
Protein, ur: 30 mg/dL — AB
Specific Gravity, Urine: 1.011 (ref 1.005–1.030)
pH: 5 (ref 5.0–8.0)

## 2015-04-02 LAB — COMPREHENSIVE METABOLIC PANEL
ALBUMIN: 2.2 g/dL — AB (ref 3.5–5.0)
ALT: 50 U/L (ref 17–63)
ANION GAP: 22 — AB (ref 5–15)
AST: 49 U/L — AB (ref 15–41)
Alkaline Phosphatase: 170 U/L — ABNORMAL HIGH (ref 38–126)
BILIRUBIN TOTAL: 1.4 mg/dL — AB (ref 0.3–1.2)
BUN: 144 mg/dL — AB (ref 6–20)
CHLORIDE: 89 mmol/L — AB (ref 101–111)
CO2: 14 mmol/L — AB (ref 22–32)
Calcium: 7.3 mg/dL — ABNORMAL LOW (ref 8.9–10.3)
Creatinine, Ser: 7.13 mg/dL — ABNORMAL HIGH (ref 0.61–1.24)
GFR calc Af Amer: 8 mL/min — ABNORMAL LOW (ref >60)
GFR calc non Af Amer: 7 mL/min — ABNORMAL LOW (ref >60)
GLUCOSE: 108 mg/dL — AB (ref 65–99)
POTASSIUM: 6.7 mmol/L — AB (ref 3.5–5.1)
SODIUM: 125 mmol/L — AB (ref 135–145)
TOTAL PROTEIN: 5.1 g/dL — AB (ref 6.5–8.1)

## 2015-04-02 LAB — RENAL FUNCTION PANEL
ANION GAP: 20 — AB (ref 5–15)
Albumin: 2 g/dL — ABNORMAL LOW (ref 3.5–5.0)
BUN: 109 mg/dL — ABNORMAL HIGH (ref 6–20)
CALCIUM: 6.9 mg/dL — AB (ref 8.9–10.3)
CO2: 12 mmol/L — AB (ref 22–32)
Chloride: 94 mmol/L — ABNORMAL LOW (ref 101–111)
Creatinine, Ser: 6.99 mg/dL — ABNORMAL HIGH (ref 0.61–1.24)
GFR calc non Af Amer: 7 mL/min — ABNORMAL LOW (ref >60)
GFR, EST AFRICAN AMERICAN: 8 mL/min — AB (ref >60)
Glucose, Bld: 78 mg/dL (ref 65–99)
PHOSPHORUS: 9.4 mg/dL — AB (ref 2.5–4.6)
Potassium: 7.4 mmol/L (ref 3.5–5.1)
SODIUM: 126 mmol/L — AB (ref 135–145)

## 2015-04-02 LAB — LACTIC ACID, PLASMA
LACTIC ACID, VENOUS: 3.1 mmol/L — AB (ref 0.5–2.0)
Lactic Acid, Venous: 4.1 mmol/L (ref 0.5–2.0)
Lactic Acid, Venous: 5.3 mmol/L (ref 0.5–2.0)

## 2015-04-02 LAB — APTT: APTT: 31 s (ref 24–36)

## 2015-04-02 LAB — MAGNESIUM
MAGNESIUM: 2.3 mg/dL (ref 1.7–2.4)
MAGNESIUM: 2.3 mg/dL (ref 1.7–2.4)

## 2015-04-02 LAB — PREPARE RBC (CROSSMATCH)

## 2015-04-02 LAB — PROTIME-INR
INR: 1.46
PROTHROMBIN TIME: 17.8 s — AB (ref 11.4–15.0)

## 2015-04-02 LAB — FIBRIN DEGRADATION PROD.(ARMC ONLY): Fibrin Degradation Prod.: >40 ug/mL — AB (ref <10)

## 2015-04-02 LAB — CK: CK TOTAL: 63 U/L (ref 49–397)

## 2015-04-02 LAB — FIBRINOGEN: FIBRINOGEN: 568 mg/dL — AB (ref 210–470)

## 2015-04-02 MED ORDER — DEXTROSE 50 % IV SOLN
25.0000 mL | Freq: Once | INTRAVENOUS | Status: AC
  Administered 2015-04-02: 25 mL via INTRAVENOUS

## 2015-04-02 MED ORDER — SODIUM CHLORIDE 0.9 % IV SOLN: INTRAVENOUS | Status: DC

## 2015-04-02 MED ORDER — MORPHINE SULFATE (PF) 2 MG/ML IV SOLN
2.0000 mg | INTRAVENOUS | Status: DC | PRN
  Filled 2015-04-02 (×2): qty 1

## 2015-04-02 MED ORDER — MORPHINE SULFATE (PF) 2 MG/ML IV SOLN
2.0000 mg | Freq: Once | INTRAVENOUS | Status: AC
  Administered 2015-04-02: 2 mg via INTRAVENOUS
  Filled 2015-04-02: qty 1

## 2015-04-02 MED ORDER — SODIUM BICARBONATE 8.4 % IV SOLN
50.0000 meq | Freq: Once | INTRAVENOUS | Status: AC
  Administered 2015-04-02: 50 meq via INTRAVENOUS
  Filled 2015-04-02: qty 50

## 2015-04-02 MED ORDER — SODIUM CHLORIDE 0.9 % IV SOLN
2.0000 g | Freq: Once | INTRAVENOUS | Status: AC
  Administered 2015-04-02: 2 g via INTRAVENOUS
  Filled 2015-04-02 (×2): qty 20

## 2015-04-02 MED ORDER — SODIUM POLYSTYRENE SULFONATE 15 GM/60ML PO SUSP: 30.0000 g | Freq: Once | ORAL | Status: DC

## 2015-04-02 MED ORDER — HEPARIN SODIUM (PORCINE) 1000 UNIT/ML DIALYSIS
1000.0000 [IU] | INTRAMUSCULAR | Status: DC | PRN
Start: 2015-04-02 — End: 2015-04-03
  Filled 2015-04-02: qty 6

## 2015-04-02 MED ORDER — INSULIN ASPART PROT & ASPART (70-30 MIX) 100 UNIT/ML ~~LOC~~ SUSP
SUBCUTANEOUS | Status: AC
  Filled 2015-04-02: qty 10

## 2015-04-02 MED ORDER — DEXTROSE 50 % IV SOLN
50.0000 mL | Freq: Once | INTRAVENOUS | Status: AC
  Administered 2015-04-02: 50 mL via INTRAVENOUS
  Filled 2015-04-02: qty 50

## 2015-04-02 MED ORDER — SUCRALFATE 1 GM/10ML PO SUSP: 1.0000 g | Freq: Three times a day (TID) | ORAL | Status: DC

## 2015-04-02 MED ORDER — DILTIAZEM HCL 25 MG/5ML IV SOLN: 10.0000 mg | Freq: Once | INTRAVENOUS | Status: DC

## 2015-04-02 MED ORDER — ONDANSETRON HCL 4 MG/2ML IJ SOLN: 4.0000 mg | Freq: Four times a day (QID) | INTRAMUSCULAR | Status: DC | PRN

## 2015-04-02 MED ORDER — DILTIAZEM HCL 25 MG/5ML IV SOLN
INTRAVENOUS | Status: AC
  Filled 2015-04-02: qty 5

## 2015-04-02 MED ORDER — INSULIN ASPART 100 UNIT/ML IV SOLN
10.0000 [IU] | Freq: Once | INTRAVENOUS | Status: AC
  Administered 2015-04-02: 10 [IU] via INTRAVENOUS

## 2015-04-02 MED ORDER — CALCIUM CARBONATE ANTACID 500 MG PO CHEW: 400.0000 mg | CHEWABLE_TABLET | Freq: Two times a day (BID) | ORAL | Status: DC

## 2015-04-02 MED ORDER — MORPHINE SULFATE (PF) 2 MG/ML IV SOLN
1.0000 mg | INTRAVENOUS | Status: DC | PRN
  Administered 2015-04-02: 1 mg via INTRAVENOUS
  Filled 2015-04-02: qty 1

## 2015-04-02 MED ORDER — SODIUM CHLORIDE 0.9 % IV SOLN
INTRAVENOUS | Status: DC
  Administered 2015-04-03: 04:00:00 via INTRAVENOUS

## 2015-04-02 MED ORDER — SODIUM CHLORIDE 0.9 % IV SOLN: 10.0000 mL/h | Freq: Once | INTRAVENOUS | Status: DC

## 2015-04-02 MED ORDER — ACETAMINOPHEN 325 MG PO TABS: 650.0000 mg | ORAL_TABLET | Freq: Four times a day (QID) | ORAL | Status: DC | PRN

## 2015-04-02 MED ORDER — SODIUM BICARBONATE 8.4 % IV SOLN
INTRAVENOUS | Status: DC
  Administered 2015-04-02: 21:00:00 via INTRAVENOUS
  Filled 2015-04-02 (×5): qty 150

## 2015-04-02 MED ORDER — IPRATROPIUM-ALBUTEROL 0.5-2.5 (3) MG/3ML IN SOLN
3.0000 mL | RESPIRATORY_TRACT | Status: DC
  Administered 2015-04-02 – 2015-04-03 (×3): 3 mL via RESPIRATORY_TRACT
  Filled 2015-04-02 (×4): qty 3

## 2015-04-02 MED ORDER — ONDANSETRON HCL 4 MG PO TABS: 4.0000 mg | ORAL_TABLET | Freq: Four times a day (QID) | ORAL | Status: DC | PRN

## 2015-04-02 MED ORDER — METHYLPREDNISOLONE SODIUM SUCC 40 MG IJ SOLR
40.0000 mg | Freq: Four times a day (QID) | INTRAMUSCULAR | Status: DC
  Administered 2015-04-03 (×2): 40 mg via INTRAVENOUS
  Filled 2015-04-02 (×2): qty 1

## 2015-04-02 MED ORDER — PUREFLOW DIALYSIS SOLUTION
INTRAVENOUS | Status: DC
Start: 2015-04-02 — End: 2015-04-03

## 2015-04-02 MED ORDER — MORPHINE SULFATE (PF) 2 MG/ML IV SOLN
2.0000 mg | Freq: Once | INTRAVENOUS | Status: AC
Start: 2015-04-02 — End: 2015-04-02
  Administered 2015-04-02: 2 mg via INTRAVENOUS

## 2015-04-02 MED ORDER — ACETAMINOPHEN 650 MG RE SUPP: 650.0000 mg | Freq: Four times a day (QID) | RECTAL | Status: DC | PRN

## 2015-04-02 MED ORDER — SODIUM CHLORIDE 0.9 % IV SOLN
Freq: Once | INTRAVENOUS | Status: AC
  Administered 2015-04-02: 14:00:00 via INTRAVENOUS

## 2015-04-02 MED ORDER — FERROUS SULFATE 325 (65 FE) MG PO TABS: 325.0000 mg | ORAL_TABLET | Freq: Two times a day (BID) | ORAL | Status: DC

## 2015-04-02 MED ORDER — ADULT MULTIVITAMIN W/MINERALS CH
1.0000 | ORAL_TABLET | Freq: Every day | ORAL | Status: DC
  Filled 2015-04-02: qty 1

## 2015-04-02 MED ORDER — METOPROLOL TARTRATE 1 MG/ML IV SOLN
INTRAVENOUS | Status: AC
Start: 2015-04-02 — End: 2015-04-03
  Filled 2015-04-02: qty 5

## 2015-04-02 MED ORDER — SODIUM CHLORIDE 0.9 % IV SOLN
1.0000 g | Freq: Once | INTRAVENOUS | Status: AC
  Administered 2015-04-02: 1 g via INTRAVENOUS

## 2015-04-02 MED ORDER — INSULIN ASPART 100 UNIT/ML ~~LOC~~ SOLN
10.0000 [IU] | Freq: Once | SUBCUTANEOUS | Status: DC
  Filled 2015-04-02: qty 10

## 2015-04-02 MED ORDER — INSULIN REGULAR HUMAN 100 UNIT/ML IJ SOLN: 10.0000 [IU] | Freq: Once | INTRAMUSCULAR | Status: DC

## 2015-04-02 MED ORDER — CALCIUM CARBONATE 1500 (600 CA) MG PO TABS
600.0000 mg | ORAL_TABLET | Freq: Two times a day (BID) | ORAL | Status: DC
  Filled 2015-04-02: qty 1

## 2015-04-02 MED ORDER — DEXTROSE 50 % IV SOLN
INTRAVENOUS | Status: AC
  Filled 2015-04-02: qty 50

## 2015-04-02 MED ORDER — CALCIUM GLUCONATE 10 % IV SOLN
INTRAVENOUS | Status: AC
  Filled 2015-04-02: qty 10

## 2015-04-02 MED ORDER — INSULIN ASPART 100 UNIT/ML ~~LOC~~ SOLN
SUBCUTANEOUS | Status: AC
  Administered 2015-04-02: 10 [IU]
  Filled 2015-04-02: qty 10

## 2015-04-02 MED ORDER — DOXAZOSIN MESYLATE 4 MG PO TABS: 2.0000 mg | ORAL_TABLET | Freq: Every day | ORAL | Status: DC

## 2015-04-02 NOTE — Progress Notes (Signed)
eLink Physician-Brief Progress Note Patient Name: Dustin Frazier DOB: Feb 13, 1941 MRN: [id number]   Date of Service  03/31/2015  HPI/Events of Note  Progressive AG met acidosis with increasing LA  eICU Interventions  Start bicarb drip at 150cc/hr while awaiting decisions regarding goals of care, CVVH. Continue Bipap >> may need intubation. Recheck labs     Intervention Category Major Interventions: Acid-Base disturbance - evaluation and management  Deeksha Cotrell 04/05/2015, 8:18 PM

## 2015-04-02 NOTE — ED Provider Notes (Signed)
[facility] Emergency Department Provider Note  ____________________________________________  Time seen: 08/15/2003  I have reviewed the triage vital signs and the nursing notes.  History by:  Patient  HISTORY  Chief Complaint Shortness of Breath     HPI Dustin Frazier is a 74 y.o. male with history of prostate cancer with metastatic disease. This is diagnoses a terminal situation 9 years ago. He continues to be cared for at [facility]. Recently, they decided to discontinue chemotherapy. They're considering hospice care.  Today, the patient is having worsening shortness of breath. This is been waxing and waning for approximately one month. It is been worse the past 2-3 days.He has worsening dyspnea with any exertion. He appears somewhat pale. He denies any fever.  The patient looks a little bit ill and reports he feels weak. He reports some focal pain in his right hip.  Patient has a known history of thrombocytopenia due to the metastatic cancer.  Past Medical History  Diagnosis Date  . Prostate cancer metastatic to bone ([place]) 04/2006  . Pneumonia   . Arthritis   . Anemia   . Thrombocytopenia ([place])   . Vertigo     Patient Active Problem List   Diagnosis Date Noted  . Chronic upper back pain 03/03/2015  . Opiate use 03/03/2015  . Opioid dependence ([place]) 03/03/2015  . Encounter for therapeutic drug level monitoring 03/03/2015  . Chronic pain of left lower extremity 03/03/2015  . Long term current use of opiate analgesic 03/02/2015  . Long term prescription opiate use 03/02/2015  . Chronic pain 03/02/2015  . Lumbar spondylosis 03/02/2015  . Chronic pain of right knee 03/02/2015  . Chronic pain syndrome 01/31/2015  . Cancer related pain 01/31/2015  . Chronic low back pain 01/31/2015  . Myofascial pain syndrome 01/31/2015  . 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 Lumbosacral  radiculopathy 01/31/2015  . Primary osteoarthritis of right knee 01/31/2015  . Arthritis, senescent 01/31/2015  . Right knee arthropathy 01/31/2015  . Lung mass 11/10/2014  . Pneumonia 11/10/2014  . Prostate cancer metastatic to bone ([place]) 11/03/2014  . Drug-induced osteonecrosis of jaw ([place]) 05/11/2013  . Persons encountering health services in other specified circumstances 02/11/2013  . Incomplete bladder emptying 12/27/2011  . CA of prostate ([place]) 12/27/2011  . Bone metastases ([place]) 12/27/2011  . Obstruction of urinary tract 12/27/2011  . Malignant neoplasm of prostate ([place]) 12/27/2011  . Malignant neoplasm metastatic to bone ([place]) 12/27/2011  . BP (high blood pressure) 11/07/2011  . Metastasis from malignant tumor of prostate ([place]) 11/07/2011    Past Surgical History  Procedure Laterality Date  . Back surgery    . Cervical disc surgery    . Kidney stone    . Nasal septum surgery    . Endobronchial ultrasound N/A 11/22/2014    Procedure: ENDOBRONCHIAL ULTRASOUND;  Surgeon: Vilinda Boehringer, MD;  Location: [facility] ORS;  Service: Cardiopulmonary;  Laterality: N/A;    Current Outpatient Rx  Name  Route  Sig  Dispense  Refill  . albuterol (PROVENTIL HFA;VENTOLIN HFA) 108 (90 BASE) MCG/ACT inhaler   Inhalation   Inhale 2 puffs into the lungs every 6 (six) hours as needed for wheezing or shortness of breath. Or with cough   1 Inhaler   2   . amoxicillin (AMOXIL) 500 MG capsule   Oral   Take 1 capsule by mouth 3 (three) times daily.         Marland Kitchen  dexamethasone (DECADRON) 4 MG tablet      Take 2 tablets twice a day the day before chemo, the day of chemo and day after chemo with each treatment   72 tablet   0   . furosemide (LASIX) 20 MG tablet      Once a day as needed Or if leg swelling significantly worse- can take up to 2 pills a day   90 tablet   0   . naproxen (NAPROSYN) 500 MG tablet   Oral   Take 1 tablet (500 mg total) by mouth 2 (two) times daily with a meal.   60  tablet   3   . predniSONE (DELTASONE) 5 MG tablet   Oral   Take 1 tablet (5 mg total) by mouth 2 (two) times daily.   60 tablet   3   . calcium carbonate (OSCAL) 1500 (600 CA) MG TABS tablet   Oral   Take 600 mg of elemental calcium by mouth 2 (two) times daily with a meal.         . chlorhexidine (PERIDEX) 0.12 % solution   Mouth Rinse   1 mL by Mouth Rinse route at bedtime.         Marland Kitchen doxazosin (CARDURA) 2 MG tablet   Oral   Take 2 mg by mouth at bedtime.          . ferrous sulfate 325 (65 FE) MG tablet   Oral   Take 325 mg by mouth 2 (two) times daily with a meal.         . leuprolide (LUPRON) 11.25 MG KIT injection   Intramuscular   Inject 1 each into the muscle every 3 (three) months.         . meclizine (ANTIVERT) 25 MG tablet      TAKE ONE TABLET BY MOUTH THREE TIMES DAILY AS NEEDED FOR DIZZINESS   60 tablet   0   . metroNIDAZOLE (FLAGYL) 500 MG tablet   Oral   Take 1 tablet by mouth 3 (three) times daily.          . Multiple Vitamin (MULTI-VITAMINS) TABS   Oral   Take 1 tablet by mouth daily.         Marland Kitchen EXPIRED: oxyCODONE (OXY IR/ROXICODONE) 5 MG immediate release tablet   Oral   Take 1 tablet (5 mg total) by mouth 5 (five) times daily as needed for severe pain.   150 tablet   0     Do not place this medication, or any other prescri ...   . sucralfate (CARAFATE) 1 GM/10ML suspension   Oral   Take 10 mLs (1 g total) by mouth 4 (four) times daily -  with meals and at bedtime.   420 mL   1     Allergies Review of patient's allergies indicates no known allergies.  Family History  Problem Relation Age of Onset  . Heart attack Father 52    death  . Diabetes Sister   . COPD Sister   . Emphysema Sister   . Heart Problems Sister   . Diabetes Brother   . Heart Problems Brother   . Liver cancer Brother     Social History Social History  Substance Use Topics  . Smoking status: Former Smoker -- 1.00 packs/day for 30 years    Types:  Cigarettes  . Smokeless tobacco: Never Used  . Alcohol Use: 0.0 oz/week    0 Standard drinks or equivalent per  week     Comment: occasional beer    Review of Systems  Constitutional: General weakness.. ENT: Negative for congestion. Cardiovascular: Negative for chest pain. Respiratory: Short of breath. See history of present illness. Gastrointestinal: Negative for abdominal pain, vomiting and diarrhea. Genitourinary: History of prostate cancer. See history of present illness. Musculoskeletal: Right hip pain. Metastatic disease to bone. Skin: Negative for rash. Neurological: Negative for headache or focal weakness   10-point ROS otherwise negative.  ____________________________________________   PHYSICAL EXAM:  VITAL SIGNS: ED Triage Vitals  Enc Vitals Group     BP 04/27/2015 1334 115/67 mmHg     Pulse Rate 04/23/2015 1334 98     Resp 04/14/2015 1334 13     Temp 04/19/2015 1334 97.6 F (36.4 C)     Temp src --      SpO2 04/11/2015 1334 100 %     Weight 04/10/2015 1334 165 lb (74.844 kg)     Height 04/22/2015 1334 _0  (1.778 m)     Head Cir --      Peak Flow --      Pain Score --      Pain Loc --      Pain Edu? --      Excl. in [place]? --     Constitutional:  Alert, communicative, intact thought process. Appears pale and somewhat weak. Tachypnea. ENT   Head: Normocephalic and atraumatic.   Nose: No congestion/rhinnorhea.       Mouth: No erythema, no swelling   Cardiovascular: Normal rate, regular rhythm, no murmur noted Respiratory:  Normal respiratory effort, no tachypnea.    Breath sounds are clear and equal bilaterally.  Gastrointestinal: Soft, no distention. Nontender Back: No muscle spasm, no tenderness, no CVA tenderness. Musculoskeletal: No deformity noted. Nontender with normal range of motion in all extremities.  No noted edema. Neurologic:  Communicative. Normal appearing spontaneous movement in all 4 extremities. No gross focal neurologic deficits are  appreciated.  Skin: The tachycardia and ecchymotic areas noted at upper right chest, right shoulder, arms, and a few other scattered areas. Psychiatric: Mood and affect are normal. Speech and behavior are normal.  ____________________________________________    LABS (pertinent positives/negatives)  Labs Reviewed  CBC WITH DIFFERENTIAL/PLATELET - Abnormal; Notable for the following:    RBC 1.71 (*)    Hemoglobin 5.2 (*)    HCT 15.3 (*)    RDW 26.1 (*)    Platelets 19 (*)    All other components within normal limits  CULTURE, BLOOD (ROUTINE X 2)  CULTURE, BLOOD (ROUTINE X 2)  URINE CULTURE  COMPREHENSIVE METABOLIC PANEL  URINALYSIS COMPLETEWITH MICROSCOPIC ([facility] ONLY)  CBC  LACTIC ACID, PLASMA  LACTIC ACID, PLASMA  TYPE AND SCREEN     ____________________________________________   EKG  ED ECG REPORT I, Karee Christopherson W, the attending physician, personally viewed and interpreted this ECG.   Date: 04/12/2015  EKG Time: 1339  Rate: 106  Rhythm:  Sinus tachycardia with right bundle-branch block  Axis: Left axis at -31  Intervals: Normal  ST&T Change: None noted   ____________________________________________    RADIOLOGY  Chest x-ray: IMPRESSION: 1. New subtle airspace opacity within the peripheral aspects of the right upper lobe which could represent focal atelectasis or early developing pneumonia. 2. Otherwise stable chest x-ray, as detailed above.  ____________________________________________   PROCEDURES  CRITICAL CARE Performed by: Ahmed Prima   Total critical care time: 40 minutes due to complex differential diagnosis, tachypnea, need for transfusion, discussions with family  and consulting staff.  Critical care time was exclusive of separately billable procedures and treating other patients.  Critical care was necessary to treat or prevent imminent or life-threatening deterioration.  Critical care was time spent personally by me on the  following activities: development of treatment plan with patient and/or surrogate as well as nursing, discussions with consultants, evaluation of patient's response to treatment, examination of patient, obtaining history from patient or surrogate, ordering and performing treatments and interventions, ordering and review of laboratory studies, ordering and review of radiographic studies, pulse oximetry and re-evaluation of patient's condition.   ____________________________________________   INITIAL IMPRESSION / ASSESSMENT AND PLAN / ED COURSE  Pertinent labs & imaging results that were available during my care of the patient were reviewed by me and considered in my medical decision making (see chart for details).  [age]-appearing [age] male with a history of metastatic prostate cancer. He does have a known history of thrombocytopenia. He now has worsening shortness of breath. He has noted tachycardia of worrisome for his thrombocytopenia. He appears pale and ill.  Chest x-ray and labs are currently pending.  ----------------------------------------- 3:36 PM on 04/22/2015 -----------------------------------------  Hemoglobin is 5.2. We will type and cross and initiate transfusion. This is been discussed with the patient and his family. They agree.  Chest x-ray shows new subtle airspace opacity within the right upper lobe. This could represent atelectasis or early developing pneumonia. Blood cultures are pending. My suspicion for pneumonia is low or. I will defer the decision on antibiotics to the inpatient team.  ____________________________________________   FINAL CLINICAL IMPRESSION(S) / ED DIAGNOSES  Final diagnoses:  General weakness  Symptomatic anemia  Thrombocytopenia ([place])  Prostate cancer metastatic to bone [facility])      Ahmed Prima, MD 04/01/2015 1546

## 2015-04-02 NOTE — ED Provider Notes (Signed)
-----------------------------------------   6:32 PM on 04/06/2015 -----------------------------------------  CENTRAL LINE Performed by: Joni Fears, Runa Whittingham Consent: The procedure was performed in an emergent situation. verbal and written consent obtained by patient.  Required items: required blood products, implants, devices, and special equipment available Patient identity confirmed: arm band and provided demographic data Time out: Immediately prior to procedure a "time out" was called to verify the correct patient, procedure, equipment, support staff and site/side marked as required. Indications: vascular access Anesthesia: local infiltration Local anesthetic: lidocaine 1% with epinephrine Anesthetic total: 3 ml Patient sedated: no Preparation: skin prepped with 2% chlorhexidine twice  Skin prep agent dried: skin prep agent completely dried prior to procedure Sterile barriers: all five maximum sterile barriers used - cap, mask, sterile gown, sterile gloves, and large sterile sheet Hand hygiene: hand hygiene performed prior to central venous catheter insertion  Location details: Right internal jugular vein   Catheter type: triple lumen Catheter size: 8 Fr Pre-procedure: landmarks identified Ultrasound guidance: Continuous real-time visualization throughout procedure . Why her placement in the internal jugular vein confirmed prior to dilation and cannulation with triple lumen catheter  Successful placement: yes Post-procedure: line sutured and dressing applied Assessment: blood return through all parts, free fluid flow, placement verified by x-ray and no pneumothorax on x-ray. Lungs clear to auscultation bilaterally, oxygen saturation stable, no increased work of breathing or respiratory distress.  Patient tolerance: Patient tolerated the procedure well with no immediate complications.   Carrie Mew, MD 04/28/2015 [phone]

## 2015-04-02 NOTE — ED Notes (Signed)
Dr. Thomasene Lot notified of lactic acid of 3.1

## 2015-04-02 NOTE — Progress Notes (Signed)
Pt. Has a very poor prognosis given advanced refractory prostate cancer and presented with multi-organ failure given renal failure, resp failure, anemia, thrombocytopenia and also noted to have schistocytes on smear.    There was some concern for TTP but discussed w/ oncology (Dr. Luciano Cutter) and they recommend to order LDH, FDP, Fibrinogen, PT/PTT and results of which are still pending.   Dr. Starleen Arms is aware of pt's poor prognosis and is here presently discussing goals of care with patients family and possibly making him a DNR and await Palliative care input tomorrow.

## 2015-04-02 NOTE — ED Notes (Signed)
Dr. Joni Fears and Dr. Tor Netters notified of lactic acid 4.1.

## 2015-04-02 NOTE — H&P (Signed)
[facility] - [place] [facility]   PATIENT NAME: Dustin Frazier    MR#:  [id number]  DATE OF BIRTH:  02/26/1941  DATE OF ADMISSION:  04/23/2015  PRIMARY CARE PHYSICIAN: WOLF,JACK H, MD   REQUESTING/REFERRING PHYSICIAN: Dr. David Kaminski.   CHIEF COMPLAINT:   Chief Complaint  Patient presents with  . Shortness of Breath    prostate ca with lung nodules    HISTORY OF PRESENT ILLNESS:  Dustin Frazier  is a 74 y.o. male with a known history of refractory prostate cancer, COPD, chronic thrombocytopenia, hypertension, who presents to the hospital due to shortness of breath and also cough congestion. Patient's daughter says that he was doing well until 2 days ago he is to become more weak and shortness of breath even on minimal exertion has gotten significantly worse. He admits to a cough with productive sputum which is now green yellow in color. He denies any fever, nausea, vomiting but does admit to poor by mouth intake. Since his symptoms were not improving he was brought to the ER for further evaluation and was noted to be anemic with hemoglobin of 5.5 and his last hemoglobin being 9. He was also noted to be in acute renal failure with hyperkalemia. Hospitalist services were contacted further treatment and evaluation.  PAST MEDICAL HISTORY:   Past Medical History  Diagnosis Date  . 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 Pneumonia   . Arthritis   . Anemia   . Thrombocytopenia (HCC)   . Vertigo     PAST SURGICAL HISTORY:   Past Surgical History  Procedure Laterality Date  . Back surgery    . Cervical disc surgery    . Kidney stone    . Nasal septum surgery    . Endobronchial ultrasound N/A 11/22/2014    Procedure: ENDOBRONCHIAL ULTRASOUND;  Surgeon: Vishal Mungal, MD;  Location: [facility] ORS;  Service: Cardiopulmonary;  Laterality: N/A;    SOCIAL HISTORY:   Social History  Substance Use Topics  . Smoking status: Former Smoker -- 1.00  packs/day for 30 years    Types: Cigarettes  . Smokeless tobacco: Never Used  . Alcohol Use: 0.0 oz/week    0 Standard drinks or equivalent per week     Comment: occasional beer    FAMILY HISTORY:   Family History  Problem Relation Age of Onset  . Heart attack Father 39    death  . Diabetes Sister   . COPD Sister   . Emphysema Sister   . Heart Problems Sister   . Diabetes Brother   . Heart Problems Brother   . Liver cancer Brother     DRUG ALLERGIES:  No Known Allergies  REVIEW OF SYSTEMS:   Review of Systems  Constitutional: Negative for fever and weight loss.  HENT: Negative for congestion, nosebleeds and tinnitus.   Eyes: Negative for blurred vision, double vision and redness.  Respiratory: Positive for cough, sputum production, shortness of breath and wheezing. Negative for hemoptysis.   Cardiovascular: Negative for chest pain, orthopnea, leg swelling and PND.  Gastrointestinal: Negative for nausea, vomiting, abdominal pain, diarrhea and melena.  Genitourinary: Negative for dysuria, urgency and hematuria.  Musculoskeletal: Negative for joint pain and falls.  Neurological: Positive for weakness (generalized). Negative for dizziness, tingling, sensory change, focal weakness, seizures and headaches.  Endo/Heme/Allergies: Negative for polydipsia. Does not bruise/bleed easily.  Psychiatric/Behavioral: Negative for depression and memory loss. The patient is not nervous/anxious.     MEDICATIONS   AT HOME:   Prior to Admission medications   Medication Sig Start Date End Date Taking? Authorizing Provider  albuterol (PROVENTIL HFA;VENTOLIN HFA) 108 (90 BASE) MCG/ACT inhaler Inhale 2 puffs into the lungs every 6 (six) hours as needed for wheezing or shortness of breath. Or with cough 03/07/15  Yes Cammie Sickle, MD  amoxicillin (AMOXIL) 500 MG capsule Take 1 capsule by mouth 3 (three) times daily. 03/22/15  Yes Historical Provider, MD  dexamethasone (DECADRON) 4 MG tablet  Take 2 tablets twice a day the day before chemo, the day of chemo and day after chemo with each treatment 02/14/15  Yes Cammie Sickle, MD  furosemide (LASIX) 20 MG tablet Once a day as needed Or if leg swelling significantly worse- can take up to 2 pills a day 03/07/15  Yes Cammie Sickle, MD  naproxen (NAPROSYN) 500 MG tablet Take 1 tablet (500 mg total) by mouth 2 (two) times daily with a meal. 03/28/15  Yes Cammie Sickle, MD  predniSONE (DELTASONE) 5 MG tablet Take 1 tablet (5 mg total) by mouth 2 (two) times daily. 12/19/14  Yes Leia Alf, MD  calcium carbonate (OSCAL) 1500 (600 CA) MG TABS tablet Take 600 mg of elemental calcium by mouth 2 (two) times daily with a meal.    Historical Provider, MD  chlorhexidine (PERIDEX) 0.12 % solution 1 mL by Mouth Rinse route at bedtime. 09/14/14   Historical Provider, MD  doxazosin (CARDURA) 2 MG tablet Take 2 mg by mouth at bedtime.     Historical Provider, MD  ferrous sulfate 325 (65 FE) MG tablet Take 325 mg by mouth 2 (two) times daily with a meal.    Historical Provider, MD  leuprolide (LUPRON) 11.25 MG KIT injection Inject 1 each into the muscle every 3 (three) months.    Historical Provider, MD  meclizine (ANTIVERT) 25 MG tablet TAKE ONE TABLET BY MOUTH THREE TIMES DAILY AS NEEDED FOR DIZZINESS 02/08/15   Cammie Sickle, MD  metroNIDAZOLE (FLAGYL) 500 MG tablet Take 1 tablet by mouth 3 (three) times daily.  03/22/15   Historical Provider, MD  Multiple Vitamin (MULTI-VITAMINS) TABS Take 1 tablet by mouth daily.    Historical Provider, MD  oxyCODONE (OXY IR/ROXICODONE) 5 MG immediate release tablet Take 1 tablet (5 mg total) by mouth 5 (five) times daily as needed for severe pain. 03/02/15 04/01/15  Milinda Pointer, MD  sucralfate (CARAFATE) 1 GM/10ML suspension Take 10 mLs (1 g total) by mouth 4 (four) times daily -  with meals and at bedtime. 01/26/15   Leia Alf, MD      VITAL SIGNS:  Blood pressure 110/56, pulse 98,  temperature 97.6 F (36.4 C), resp. rate 17, height 5' 10" (1.778 m), weight 81.5 kg (179 lb 10.8 oz), SpO2 100 %.  PHYSICAL EXAMINATION:  Physical Exam  GENERAL:  74 y.o.-year-old patient lying in the bed in mild to moderate respiratory distress.  EYES: Pupils equal, round, reactive to light and accommodation. No scleral icterus. Extraocular muscles intact.  HEENT: Head atraumatic, normocephalic. Oropharynx and nasopharynx clear. No oropharyngeal erythema, moist oral mucosa  NECK:  Supple, no jugular venous distention. No thyroid enlargement, no tenderness.  LUNGS: Diffuse wheezing, rhonchi bilaterally. Positive use of accessory muscles. No dullness to percussion.  CARDIOVASCULAR: S1, S2 RRR. No murmurs, rubs, gallops, clicks.  ABDOMEN: Soft, nontender, nondistended. Bowel sounds present. No organomegaly or mass.  EXTREMITIES: No pedal edema, cyanosis, or clubbing. + 2 pedal & radial pulses b/l.  NEUROLOGIC: Cranial nerves II through XII are intact. No focal Motor or sensory deficits appreciated b/l.  Globally weak PSYCHIATRIC: The patient is alert and oriented x 3. Good affect.  SKIN: No obvious rash, lesion, or ulcer.   LABORATORY PANEL:   CBC  Recent Labs Lab 04/21/2015 1351  WBC 10.5  HGB 5.2*  HCT 15.3*  PLT 19*   ------------------------------------------------------------------------------------------------------------------  Chemistries   Recent Labs Lab 04/06/2015 1351  NA 125*  K 6.7*  CL 89*  CO2 14*  GLUCOSE 108*  BUN 144*  CREATININE 7.13*  CALCIUM 7.3*  AST 49*  ALT 50  ALKPHOS 170*  BILITOT 1.4*   ------------------------------------------------------------------------------------------------------------------  Cardiac Enzymes No results for input(s): TROPONINI in the last 168 hours. ------------------------------------------------------------------------------------------------------------------  RADIOLOGY:  Dg Chest 1 View  04/24/2015  CLINICAL  DATA:  End-stage prostate cancer, productive cough. EXAM: CHEST 1 VIEW COMPARISON:  Chest x-rays dated 11/22/2014 and 11/03/2014. Comparison also made to chest CT dated 02/23/2015 and nuclear medicine bone scan dated 03/30/2015. FINDINGS: Heart size is upper normal, unchanged. Overall cardiomediastinal silhouette is stable in size and configuration. There is a stable left perihilar opacity, compatible with the irregular mass described on recent chest CT. There is a new subtle airspace opacity within the lateral aspects of the right upper lobe, suggesting atelectasis or developing pneumonia. Small left pleural effusion is unchanged. The diffuse osseous metastases are stable in the short-term interval. IMPRESSION: 1. New subtle airspace opacity within the peripheral aspects of the right upper lobe which could represent focal atelectasis or early developing pneumonia. 2. Otherwise stable chest x-ray, as detailed above. Electronically Signed   By: Stan  Maynard M.D.   On: 04/05/2015 15:21     IMPRESSION AND PLAN:   74-year-old male with past medical history of refractory prostate cancer, hypertension, chronic thrombocytopenia, who presents to the hospital due to shortness of breath, cough and weakness and noted to be severely anemic and also noted to be in acute renal failure with hyperkalemia.  #1 acute renal failure-this is likely obstructive in nature. Patient's previous creatinine a few days back was completely normal. He does have a history of prostate cancer which is refractory. -We will place a Foley catheter and follow urine output. -I will consult nephrology. Renal dose meds avoid nephrotoxins. Follow BUN and creatinine, urine output. -I will also start the patient on IV fluids.  #2 hyperkalemia-is likely result of the patient's acute renal failure. -There is no acute EKG changes consistent with hyperkalemia. I will give the patient D50, insulin and calcium gluconate and follow potassium level.  Place him on telemetry.  #3 anemia-this is acute on chronic anemia and likely related to his chemotherapy. He is presently asymptomatic with it. -He will be transfused and we will follow serial hemoglobin. Continue iron supplements.  #4 thrombocytopenia-also chronic and probably related to his chemotherapy and underlying malignancy. -We will follow platelet count. I will consult oncology.  #5 refractory prostate cancer-continue pain control with as needed morphine. -I will consult oncology but as per them patient has very limited treatment left. I will consult palliative care to discuss goals of care.  #6 shortness of breath/cough-due to COPD exacerbation. -I will place the patient on IV steroids, DuoNeb nebs around-the-clock. Patient's chest x-ray is negative for any acute pneumonia. Follow clinically. - ABG done in the ER showing no evidence of CO2 retention or acidosis.   #7 BPH-continue doxazosin.   All the records are reviewed and case discussed with ED provider. Management plans   discussed with the patient, family and they are in agreement.  CODE STATUS: Full  TOTAL CRITICAL CARE TIME TAKING CARE OF THIS PATIENT: 50 minutes.    SAINANI,VIVEK J M.D on 04/16/2015 at 4:56 PM  Between 7am to 6pm - Pager - [phone]  After 6pm go to [email or web address] - [email or web address]  [facility]  Office  [phone]  CC: Primary care physician; WOLF,JACK H, MD     

## 2015-04-02 NOTE — Progress Notes (Signed)
Patient has poor prognosis case discussed with family and care providers All in agreement for no aggressive care However noted EKG changes with pauses in the setting of hyperkalemia will treat hyperkalemia calcium insulin dextrose sodium bicarbonate as well as Kayexalate with the understanding that refractory kidney failure and electrolytes abnormalities would ultimately require dialysis which is against the patient's wishes

## 2015-04-02 NOTE — ED Notes (Signed)
Dr. Alesia Richards notified of potassium level of 6.7.

## 2015-04-02 NOTE — ED Notes (Signed)
Dr. Thomasene Lot notified of platelet count of 19

## 2015-04-02 NOTE — Consult Note (Signed)
[facility] CONSULT NOTE    Date: 04/24/2015                  Patient Name:  Dustin Frazier  MRN: [id number]  DOB: Feb 17, 1941  Age / Sex: 74 y.o., male         PCP: [name], MD                 Service Requesting Consult: Dr. Verdell Carmine                 Reason for Consult: Severe acute renal failure, Hyperkalemia            History of Present Illness: Patient is a 74 y.o. male with a PMHx of broadly metastatic prostate cancer, osteoarthritis, anemia, thrombocytopenia, vertigo, who was admitted to [facility] on 04/27/2015 for evaluation of shortness of breath. The patient is actively followed by oncology. He was last seen on 03/28/2015. He has been on therapy with Taxotere but last received is approximately 4 weeks ago. He's had reduced dose of Taxotere given bone marrow toxicity. He presents today with severe metabolic derangements. Serum sodium is 125, potassium 6.7, serum bicarbonate 14, B1 144, and creatinine of 7.1. A few short days ago on 03/28/2015 creatinine was normal at 0.89. His platelets have also fallen to 19,000. He has purpura on his chest. I had a long discussion with him and his daughter in the emergency department. The patient wishes for active therapy at this time. We talked about renal replacement therapy and he wishes to proceed with continuous renal replacement therapy at this point in time.   Medications: Outpatient medications:  (Not in a hospital admission)  Current medications: Current Facility-Administered Medications  Medication Dose Route Frequency Provider Last Rate Last Dose  . 0.9 %  sodium chloride infusion   Intravenous Continuous Ahmed Prima, MD      . 0.9 %  sodium chloride infusion  10 mL/hr Intravenous Once Ahmed Prima, MD      . calcium gluconate 10 % injection           . dextrose 50 % solution           . insulin aspart (novoLOG) injection 10 Units  10 Units Subcutaneous Once Henreitta Leber, MD      .  ipratropium-albuterol (DUONEB) 0.5-2.5 (3) MG/3ML nebulizer solution 3 mL  3 mL Nebulization Q4H Henreitta Leber, MD   3 mL at 04/18/2015 1631   Current Outpatient Prescriptions  Medication Sig Dispense Refill  . albuterol (PROVENTIL HFA;VENTOLIN HFA) 108 (90 BASE) MCG/ACT inhaler Inhale 2 puffs into the lungs every 6 (six) hours as needed for wheezing or shortness of breath. Or with cough 1 Inhaler 2  . amoxicillin (AMOXIL) 500 MG capsule Take 1 capsule by mouth 3 (three) times daily.    Marland Kitchen dexamethasone (DECADRON) 4 MG tablet Take 2 tablets twice a day the day before chemo, the day of chemo and day after chemo with each treatment 72 tablet 0  . furosemide (LASIX) 20 MG tablet Once a day as needed Or if leg swelling significantly worse- can take up to 2 pills a day 90 tablet 0  . naproxen (NAPROSYN) 500 MG tablet Take 1 tablet (500 mg total) by mouth 2 (two) times daily with a meal. 60 tablet 3  . predniSONE (DELTASONE) 5 MG tablet Take 1 tablet (5 mg total) by mouth 2 (two) times daily. 60 tablet 3  . calcium  carbonate (OSCAL) 1500 (600 CA) MG TABS tablet Take 600 mg of elemental calcium by mouth 2 (two) times daily with a meal.    . chlorhexidine (PERIDEX) 0.12 % solution 1 mL by Mouth Rinse route at bedtime.    Marland Kitchen doxazosin (CARDURA) 2 MG tablet Take 2 mg by mouth at bedtime.     . ferrous sulfate 325 (65 FE) MG tablet Take 325 mg by mouth 2 (two) times daily with a meal.    . leuprolide (LUPRON) 11.25 MG KIT injection Inject 1 each into the muscle every 3 (three) months.    . meclizine (ANTIVERT) 25 MG tablet TAKE ONE TABLET BY MOUTH THREE TIMES DAILY AS NEEDED FOR DIZZINESS 60 tablet 0  . metroNIDAZOLE (FLAGYL) 500 MG tablet Take 1 tablet by mouth 3 (three) times daily.     . Multiple Vitamin (MULTI-VITAMINS) TABS Take 1 tablet by mouth daily.    Marland Kitchen oxyCODONE (OXY IR/ROXICODONE) 5 MG immediate release tablet Take 1 tablet (5 mg total) by mouth 5 (five) times daily as needed for severe pain. 150  tablet 0  . sucralfate (CARAFATE) 1 GM/10ML suspension Take 10 mLs (1 g total) by mouth 4 (four) times daily -  with meals and at bedtime. 420 mL 1   Facility-Administered Medications Ordered in Other Encounters  Medication Dose Route Frequency Provider Last Rate Last Dose  . Influenza vac split quadrivalent PF (FLUARIX) injection 0.5 mL  0.5 mL Intramuscular Once Leia Alf, MD          Allergies: No Known Allergies    Past Medical History: Past Medical History  Diagnosis Date  . 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 Pneumonia   . Arthritis   . Anemia   . Thrombocytopenia ([place])   . Vertigo      Past Surgical History: Past Surgical History  Procedure Laterality Date  . Back surgery    . Cervical disc surgery    . Kidney stone    . Nasal septum surgery    . Endobronchial ultrasound N/A 11/22/2014    Procedure: ENDOBRONCHIAL ULTRASOUND;  Surgeon: Vilinda Boehringer, MD;  Location: [facility] ORS;  Service: Cardiopulmonary;  Laterality: N/A;     Family History: Family History  Problem Relation Age of Onset  . Heart attack Father 60    death  . Diabetes Sister   . COPD Sister   . Emphysema Sister   . Heart Problems Sister   . Diabetes Brother   . Heart Problems Brother   . Liver cancer Brother      Social History: Social History   Social History  . Marital Status: Single    Spouse Name: N/A  . Number of Children: N/A  . Years of Education: N/A   Occupational History  . Not on file.   Social History Main Topics  . Smoking status: Former Smoker -- 1.00 packs/day for 30 years    Types: Cigarettes  . Smokeless tobacco: Never Used  . Alcohol Use: 0.0 oz/week    0 Standard drinks or equivalent per week     Comment: occasional beer  . Drug Use: No  . Sexual Activity: Not on file   Other Topics Concern  . Not on file   Social History Narrative     Review of Systems: Review of Systems  Constitutional: Positive for malaise/fatigue. Negative  for fever and chills.  HENT: Negative for ear discharge and ear pain.   Eyes: Negative for blurred vision and double  vision.  Respiratory: Positive for shortness of breath. Negative for cough and hemoptysis.   Cardiovascular: Negative for chest pain, palpitations and leg swelling.  Gastrointestinal: Negative for heartburn, nausea and vomiting.  Genitourinary: Negative for dysuria, urgency and frequency.  Musculoskeletal: Positive for back pain and joint pain. Negative for myalgias.  Skin: Negative for rash.  Neurological: Positive for weakness. Negative for dizziness, focal weakness and headaches.  Endo/Heme/Allergies: Negative for polydipsia. Bruises/bleeds easily.  Psychiatric/Behavioral: Negative for depression. The patient is not nervous/anxious.      Vital Signs: Blood pressure 118/61, pulse 117, temperature 97.6 F (36.4 C), resp. rate 12, height 5' 10"  (1.778 m), weight 81.5 kg (179 lb 10.8 oz), SpO2 100 %.  Weight trends: Filed Weights   04/15/2015 1334 04/08/2015 1403  Weight: 74.844 kg (165 lb) 81.5 kg (179 lb 10.8 oz)    Physical Exam: General: Critically ill appearing  Head: Normocephalic, atraumatic.  Eyes: Anicteric, PERRL  Nose: No epistaxis noted  Throat: bipap facemask on.   Neck: Supple, trachea midline.  Lungs:  Increased work of breathingt. Clear to auscultation BL without crackles or wheezes.  Heart: Tachycardic, S1S2  Abdomen:  BS normoactive. Soft, Nondistended, non-tender.  No masses or organomegaly.  Extremities: 2+ b/l LE edema.  Neurologic: Awake, alert, following commands  Skin: Wide spread purpura    Lab results: Basic Metabolic Panel:  Recent Labs Lab 03/28/15 0827 04/15/2015 1351  NA 137 125*  K 3.3* 6.7*  CL 100* 89*  CO2 21* 14*  GLUCOSE 263* 108*  BUN 14 144*  CREATININE 0.89 7.13*  CALCIUM 8.9 7.3*    Liver Function Tests:  Recent Labs Lab 03/28/15 0827 04/19/2015 1351  AST 48* 49*  ALT 33 50  ALKPHOS 131* 170*  BILITOT 0.5  1.4*  PROT 6.4* 5.1*  ALBUMIN 3.4* 2.2*   No results for input(s): LIPASE, AMYLASE in the last 168 hours. No results for input(s): AMMONIA in the last 168 hours.  CBC:  Recent Labs Lab 03/28/15 0827 04/06/2015 1351  WBC 10.1 10.5  NEUTROABS 9.4* 9.0*  HGB 9.9* 5.2*  HCT 29.7* 15.3*  MCV 89.0 89.5  PLT 41* 19*    Cardiac Enzymes: No results for input(s): CKTOTAL, CKMB, CKMBINDEX, TROPONINI in the last 168 hours.  BNP: Invalid input(s): POCBNP  CBG: No results for input(s): GLUCAP in the last 168 hours.  Microbiology: Results for orders placed or performed during the hospital encounter of 11/22/14  Acid Fast Smear+Culture W/Rflx ([facility] only)     Status: None   Collection Time: 11/22/14  9:46 AM  Result Value Ref Range Status   AFB Specimen Processing Concentration  Final   Acid Fast Smear Negative  Final   Acid Fast Culture Negative  Final    Comment: (NOTE) No acid fast bacilli isolated after 6 weeks. Performed At: [facility] [place], [place] [id number] Lindon Romp MD GE:[id number]    Source of Sample BRONCHIAL ALVEOLAR LAVAGE  Corrected  Culture, bal-quantitative     Status: None   Collection Time: 11/22/14  9:46 AM  Result Value Ref Range Status   Specimen Description BRONCHIAL ALVEOLAR LAVAGE  Final   Special Requests Normal  Final   Gram Stain   Final    FEW WBC SEEN RARE GRAM NEGATIVE RODS GOOD SPECIMEN - 80-90% WBCS    Culture   Final    MODERATE GROWTH HAEMOPHILUS INFLUENZAE LIGHT GROWTH RALSTONIA PICKETTI Fungus isolated-identification to follow REFERRED TO [place]  [place], [place] IDENTIFICATION/CONFIRMATION    Report Status 11/28/2014 FINAL  Final   Organism ID, Bacteria RALSTONIA PICKETTI  Final   Organism ID, Bacteria HAEMOPHILUS INFLUENZAE  Final      Susceptibility   Ralstonia picketti - MIC*    AMPICILLIN SENSITIVE Sensitive     GENTAMICIN RESISTANT Resistant     PIP/TAZO  SENSITIVE Sensitive     TRIMETH/SULFA SENSITIVE Sensitive     CEFTAZIDIME INTERMEDIATE Intermediate     CEFAZOLIN RESISTANT Resistant     CEFTRIAXONE SENSITIVE Sensitive     CIPROFLOXACIN SENSITIVE Sensitive     * LIGHT GROWTH RALSTONIA PICKETTI  Fungus Culture with Smear     Status: None   Collection Time: 11/22/14  9:46 AM  Result Value Ref Range Status   Specimen Description BRONCHIAL ALVEOLAR LAVAGE  Final   Special Requests Normal  Final   Culture CANDIDA TROPICALIS  Final   Report Status 12/12/2014 FINAL  Final    Coagulation Studies: No results for input(s): LABPROT, INR in the last 72 hours.  Urinalysis: No results for input(s): COLORURINE, LABSPEC, PHURINE, GLUCOSEU, HGBUR, BILIRUBINUR, KETONESUR, PROTEINUR, UROBILINOGEN, NITRITE, LEUKOCYTESUR in the last 72 hours.  Invalid input(s): APPERANCEUR    Imaging: Dg Chest 1 View  04/29/2015  CLINICAL DATA:  End-stage prostate cancer, productive cough. EXAM: CHEST 1 VIEW COMPARISON:  Chest x-rays dated 11/22/2014 and 11/03/2014. Comparison also made to chest CT dated 02/23/2015 and nuclear medicine bone scan dated 03/30/2015. FINDINGS: Heart size is upper normal, unchanged. Overall cardiomediastinal silhouette is stable in size and configuration. There is a stable left perihilar opacity, compatible with the irregular mass described on recent chest CT. There is a new subtle airspace opacity within the lateral aspects of the right upper lobe, suggesting atelectasis or developing pneumonia. Small left pleural effusion is unchanged. The diffuse osseous metastases are stable in the short-term interval. IMPRESSION: 1. New subtle airspace opacity within the peripheral aspects of the right upper lobe which could represent focal atelectasis or early developing pneumonia. 2. Otherwise stable chest x-ray, as detailed above. Electronically Signed   By: Franki Cabot M.D.   On: 04/20/2015 15:21      Assessment & Plan: Pt is a 74 y.o. male with  a PMHx of broadly metastatic prostate cancer, osteoarthritis, anemia, thrombocytopenia, vertigo, who was admitted to [facility] on 04/12/2015 for evaluation of shortness of breath.   1. Acute renal failure, unspecified. 2. Acute respiratory failure. 3. Severe hyperkalemia. 4. Compensated metabolic acidosis. 5. Severe thrombocytopenia. 6. Broadly metastatic prostate cancer.  Plan: The patient presents with severe acute renal failure. On 03/28/2015 patient had normal renal function. He has severe azotemia and hyperkalemia. He was being treated with taxotere as an outpatient. The patient is not yet ready to proceed with hospice care.  Therefore he is open to a trial of continuous renal replacement therapy. I've discussed this with vascular surgery and they will be placing temporary dialysis catheter in the relatively near future. We will proceed with continuous renal replacement therapy with no ultrafiltration. We will set potassium bath to 2K. We will also obtain renal ultrasound to make sure there is no underlying obstruction though he did not have significant amount of urine output once Foley was placed. TTP is also in the differential and we would recommend oncology consultation which has been placed. Overall prognosis appears to be quite guarded at this time.

## 2015-04-02 NOTE — Consult Note (Signed)
[name] CONSULT NOTE  Patient Care Team: Dustin Spire, MD as PCP - General (Family Medicine)  CHIEF COMPLAINTS/PURPOSE OF CONSULTATION:  Metastatic prostate cancer/severe anemia acute renal failure  HISTORY OF PRESENTING ILLNESS: Please note patient is in the ICU/high flow nasal oxygen; unable to give good history. History is taken talking to patient's daughter at the bedside/reviewing the chart; and also talking to the hospitalist doctor.   Dustin Frazier 74 y.o.  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 As per the daughter patient had been doing okay; however in the last 2 days noted to have progressive weakness-to a poi the patient was having trouble getting out of the chair.   In the emergency room patient was found to be hypotensive; acidotic; acute renal failure with a creatinine of 7; anemia hemoglobin of 5 along with schistocytes. 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 Patient has been hypoxic needing high flow nasal oxygen. She  ROS: review of systems cannot be done because of patient's clinical status.   MEDICAL HISTORY:  Past Medical History  Diagnosis Date  . 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 Pneumonia   . Arthritis   . Anemia   . Thrombocytopenia ([place])   . Vertigo     SURGICAL HISTORY: Past Surgical History  Procedure Laterality Date  . Back surgery    . Cervical disc surgery    . Kidney stone    . Nasal septum surgery    . Endobronchial ultrasound N/A 11/22/2014    Procedure: ENDOBRONCHIAL ULTRASOUND;  Surgeon: Vilinda Boehringer, MD;  Location: [facility] ORS;  Service: Cardiopulmonary;  Laterality: N/A;    SOCIAL HISTORY: Social History   Social History  . Marital Status: Single    Spouse Name: N/A  . Number of Children: N/A  . Years of Education: N/A    Occupational History  . Not on file.   Social History Main Topics  . Smoking status: Former Smoker -- 1.00 packs/day for 30 years    Types: Cigarettes  . Smokeless tobacco: Never Used  . Alcohol Use: 0.0 oz/week    0 Standard drinks or equivalent per week     Comment: occasional beer  . Drug Use: No  . Sexual Activity: Not on file   Other Topics Concern  . Not on file   Social History Narrative    FAMILY HISTORY: Family History  Problem Relation Age of Onset  . Heart attack Father 40    death  . Diabetes Sister   . COPD Sister   . Emphysema Sister   . Heart Problems Sister   . Diabetes Brother   . Heart Problems Brother   . Liver cancer Brother     ALLERGIES:  has No Known Allergies.  MEDICATIONS:  Current Facility-Administered Medications  Medication Dose Route Frequency Provider Last Rate Last Dose  . 0.9 %  sodium chloride infusion   Intravenous Continuous Ahmed Prima, MD      . 0.9 %  sodium chloride infusion  10 mL/hr Intravenous Once Ahmed Prima, MD      . 0.9 %  sodium chloride infusion   Intravenous Continuous Henreitta Leber, MD      . acetaminophen (TYLENOL) tablet 650 mg  650 mg Oral Q6H PRN Henreitta Leber, MD       Or  . acetaminophen (TYLENOL) suppository  650 mg  650 mg Rectal Q6H PRN Henreitta Leber, MD      . Derrill Memo ON 04-14-2015] calcium carbonate (OSCAL) tablet 1,500 mg  600 mg of elemental calcium Oral BID WC Henreitta Leber, MD      . calcium gluconate 10 % injection           . dextrose 50 % solution           . diltiazem (CARDIZEM) 25 MG/5ML injection           . diltiazem (CARDIZEM) injection 10 mg  10 mg Intravenous Once Henreitta Leber, MD      . doxazosin (CARDURA) tablet 2 mg  2 mg Oral QHS Henreitta Leber, MD      . Derrill Memo ON April 14, 2015] ferrous sulfate tablet 325 mg  325 mg Oral BID WC Henreitta Leber, MD      . heparin injection 1,000-6,000 Units  1,000-6,000 Units CRRT PRN Munsoor Lateef, MD      . insulin aspart (novoLOG)  injection 10 Units  10 Units Subcutaneous Once Henreitta Leber, MD      . ipratropium-albuterol (DUONEB) 0.5-2.5 (3) MG/3ML nebulizer solution 3 mL  3 mL Nebulization Q4H Henreitta Leber, MD   3 mL at 04/09/2015 1631  . methylPREDNISolone sodium succinate (SOLU-MEDROL) 40 mg/mL injection 40 mg  40 mg Intravenous Q6H Henreitta Leber, MD      . metoprolol (LOPRESSOR) 1 MG/ML injection           . morphine 2 MG/ML injection 1 mg  1 mg Intravenous Q3H PRN Henreitta Leber, MD      . multivitamin with minerals tablet 1 tablet  1 tablet Oral Daily Henreitta Leber, MD      . ondansetron (ZOFRAN) tablet 4 mg  4 mg Oral Q6H PRN Henreitta Leber, MD       Or  . ondansetron (ZOFRAN) injection 4 mg  4 mg Intravenous Q6H PRN Henreitta Leber, MD      . pureflow IV solution for Dialysis   CRRT Continuous Munsoor Lateef, MD      . sodium bicarbonate 150 mEq in dextrose 5 % 1,000 mL infusion   Intravenous Continuous Praveen Mannam, MD      . sucralfate (CARAFATE) 1 GM/10ML suspension 1 g  1 g Oral TID WC & HS Henreitta Leber, MD       Facility-Administered Medications Ordered in Other Encounters  Medication Dose Route Frequency Provider Last Rate Last Dose  . Influenza vac split quadrivalent PF (FLUARIX) injection 0.5 mL  0.5 mL Intramuscular Once Leia Alf, MD          .  PHYSICAL EXAMINATION:  Filed Vitals:   04/19/2015 1806 04/17/2015 2045  BP:    Pulse: 117 76  Temp:    Resp:  23   Filed Weights   04/20/2015 1334 04/10/2015 1403  Weight: 165 lb (74.844 kg) 179 lb 10.8 oz (81.5 kg)    GENERAL: Elderly-appearing Caucasian male patient; on high flow nasal oxygen. Sick-appearing. He is accompanied by his daughter EYES: Positive for pallor OROPHARYNX: Cannot be tested NECK: supple, no masses felt LYMPH:  no palpable lymphadenopathy in the cervical, axillary or inguinal regions LUNGS: Decreased breath sounds bilaterally at the bases HEART/CVS: Regular rhythm tachycardia no murmurs positive for 2+  edema bilateral lower extremities ABDOMEN: abdomen soft, non-tender and normal bowel sounds Musculoskeletal:no cyanosis of digits and no clubbing  PSYCH: alert &  oriented; however cannot be tested because patient's serious clinical situation NEURO: no focal motor/sensory deficits SKIN: Multiple ecchymosis   LABORATORY DATA:  I have reviewed the data as listed Lab Results  Component Value Date   WBC 10.5 04/12/2015   HGB 5.2* 04/04/2015   HCT 15.3* 04/16/2015   MCV 89.5 04/11/2015   PLT 19* 04/23/2015    Recent Labs  11/03/14 0957  12/27/14 0942  01/18/15 0949  02/28/15 1003 03/28/15 0827 04/14/2015 1351  NA  --   < > 136  --  140  --  136 137 125*  K  --   < > 4.3  --  5.0  < > 3.6 3.3* 6.7*  CL  --   < > 106  --  108  --  100* 100* 89*  CO2  --   < > 23  --  25  --  25 21* 14*  GLUCOSE  --   < > 91  --  93  --  120* 263* 108*  BUN  --   < > 15  --  16  --  24* 14 144*  CREATININE 0.62  < > 0.58*  < > 0.69  < > 0.89 0.89 7.13*  CALCIUM 7.9*  < > 7.6*  --  9.3  --  8.1* 8.9 7.3*  GFRNONAA >60  < > >60  < > >60  < > >60 >60 7*  GFRAA >60  < > >60  < > >60  < > >60 >60 8*  PROT 6.9  < > 5.7*  --  5.9*  --  5.7* 6.4* 5.1*  ALBUMIN 3.6  < > 3.0*  --  2.9*  --  3.4* 3.4* 2.2*  AST 30  < > 24  --  22  --  24 48* 49*  ALT 25  < > 23  --  15*  --  39 33 50  ALKPHOS 151*  < > 157*  --  99  --  126 131* 170*  BILITOT 0.6  < > 0.6  --  0.3  --  0.6 0.5 1.4*  BILIDIR <0.1*  --  <0.1*  --  <0.1*  --   --   --   --   IBILI NOT CALCULATED  --  NOT CALCULATED  --  NOT CALCULATED  --   --   --   --   < > = values in this interval not displayed.  RADIOGRAPHIC STUDIES: I have personally reviewed the radiological images as listed and agreed with the findings in the report. Dg Chest 1 View  03/30/2015  CLINICAL DATA:  End-stage prostate cancer, productive cough. EXAM: CHEST 1 VIEW COMPARISON:  Chest x-rays dated 11/22/2014 and 11/03/2014. Comparison also made to chest CT dated 02/23/2015 and  nuclear medicine bone scan dated 03/30/2015. FINDINGS: Heart size is upper normal, unchanged. Overall cardiomediastinal silhouette is stable in size and configuration. There is a stable left perihilar opacity, compatible with the irregular mass described on recent chest CT. There is a new subtle airspace opacity within the lateral aspects of the right upper lobe, suggesting atelectasis or developing pneumonia. Small left pleural effusion is unchanged. The diffuse osseous metastases are stable in the short-term interval. IMPRESSION: 1. New subtle airspace opacity within the peripheral aspects of the right upper lobe which could represent focal atelectasis or early developing pneumonia. 2. Otherwise stable chest x-ray, as detailed above. Electronically Signed   By: Roxy Horseman.D.  On: 03/30/2015 15:21   Nm Bone Scan Whole Body  03/30/2015  CLINICAL DATA:  Prostate cancer. EXAM: NUCLEAR MEDICINE WHOLE BODY BONE SCAN TECHNIQUE: Whole body anterior and posterior images were obtained approximately 3 hours after intravenous injection of radiopharmaceutical. RADIOPHARMACEUTICALS:  22.3 mCi Technetium-24mMDP IV COMPARISON:  01/06/2014. FINDINGS: Faint visualization the kidneys. Diffuse progression of metastatic lesions are noted throughout the axial and appendicular skeleton. New activity is particularly severe about the of proximal femurs and both humeri. Mild improvement of lower lumbar spine activity. IMPRESSION: Widespread increased metastatic disease with particularly prominent increase in activity about the of proximal femurs and both humeri. Electronically Signed   By: TMarcello Moores Register   On: 03/30/2015 13:36   Dg Chest Portable 1 View  04/14/2015  CLINICAL DATA:  Central line placement.  Initial encounter. EXAM: PORTABLE CHEST 1 VIEW COMPARISON:  Chest radiograph performed earlier today at 2:28 p.m. FINDINGS: The patient's right IJ line is noted ending about the distal SVC. Scattered densities about the  lung fields are thought to reflect overlying metastatic lesions within the ribs. No definite focal airspace consolidation is seen. No pleural effusion or pneumothorax identified. The cardiomediastinal silhouette is normal in size. Numerous sclerotic metastatic lesions are again noted throughout the visualized osseous structures. IMPRESSION: 1. Right IJ line noted ending about the distal SVC. 2. No definite focal airspace consolidation seen. 3. Numerous sclerotic metastatic lesions again noted throughout the visualized osseous structures. Electronically Signed   By: JGarald BaldingM.D.   On: 04/07/2015 18:43    Assessment and plan: 74year old male patient history of metastatic castrate resistant prostate cancer progressed on fourth line therapy with Taxotere currently with acute renal failure severe anemia.  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   Recommendations:  I had a long discussion the patient who was able to partly express his wishes [patient on high flow-nasal oxygen ] - of not going through any aggressive care. I also discussed at length with patient's daughter LLattie Haw  and also another daughter TRhea Bleacherover the phone that the sudden clinical deterioration of the patient is likely from his progressive cancer. After lengthy discussion with her and her sister agreed not to escalate care further [facility] and for now continue current care.  await palliative care recommendations in the morning.   All questions were answered. 70 minutes face-to-face spent with the patient and family with more than 50% time spent on discussing the overall poor prognosis/DNR/DNI/and not escalating the level of care.  I spoke to patient's hospitalist Drs. Dr. SBaldo Ash also spoke to the intensivist  Dr.Mungal/ICU nurse about the developments/and the above plan of care.      GCammie Sickle MD 04/09/2015 8:48 PM

## 2015-04-03 ENCOUNTER — Ambulatory Visit: Admission: RE | Admit: 2015-04-03 | Payer: Medicare Other | Source: Ambulatory Visit

## 2015-04-03 DIAGNOSIS — R6521 Severe sepsis with septic shock: Secondary | ICD-10-CM

## 2015-04-03 DIAGNOSIS — C61 Malignant neoplasm of prostate: Secondary | ICD-10-CM

## 2015-04-03 DIAGNOSIS — E872 Acidosis, unspecified: Secondary | ICD-10-CM | POA: Insufficient documentation

## 2015-04-03 DIAGNOSIS — A419 Sepsis, unspecified organism: Secondary | ICD-10-CM

## 2015-04-03 DIAGNOSIS — N179 Acute kidney failure, unspecified: Principal | ICD-10-CM

## 2015-04-03 DIAGNOSIS — C7951 Secondary malignant neoplasm of bone: Secondary | ICD-10-CM

## 2015-04-03 DIAGNOSIS — J9601 Acute respiratory failure with hypoxia: Secondary | ICD-10-CM

## 2015-04-03 DIAGNOSIS — R000 Tachycardia, unspecified: Secondary | ICD-10-CM

## 2015-04-03 DIAGNOSIS — E875 Hyperkalemia: Secondary | ICD-10-CM

## 2015-04-03 LAB — CBC
HEMATOCRIT: 18.2 % — AB (ref 40.0–52.0)
HEMOGLOBIN: 6.3 g/dL — AB (ref 13.0–18.0)
MCH: 29.2 pg (ref 26.0–34.0)
MCHC: 34.4 g/dL (ref 32.0–36.0)
MCV: 85 fL (ref 80.0–100.0)
Platelets: 18 10*3/uL — CL (ref 150–440)
RBC: 2.14 MIL/uL — AB (ref 4.40–5.90)
RDW: 16.5 % — ABNORMAL HIGH (ref 11.5–14.5)
WBC: 4 10*3/uL (ref 3.8–10.6)

## 2015-04-03 LAB — MAGNESIUM
MAGNESIUM: 2.1 mg/dL (ref 1.7–2.4)
Magnesium: 2.1 mg/dL (ref 1.7–2.4)

## 2015-04-03 LAB — RENAL FUNCTION PANEL
ALBUMIN: 2 g/dL — AB (ref 3.5–5.0)
ALBUMIN: 2 g/dL — AB (ref 3.5–5.0)
ANION GAP: 18 — AB (ref 5–15)
Albumin: 1.8 g/dL — ABNORMAL LOW (ref 3.5–5.0)
Anion gap: 19 — ABNORMAL HIGH (ref 5–15)
Anion gap: 21 — ABNORMAL HIGH (ref 5–15)
BUN: 153 mg/dL — ABNORMAL HIGH (ref 6–20)
BUN: 156 mg/dL — AB (ref 6–20)
BUN: 160 mg/dL — AB (ref 6–20)
CALCIUM: 7.2 mg/dL — AB (ref 8.9–10.3)
CALCIUM: 6.9 mg/dL — AB (ref 8.9–10.3)
CHLORIDE: 93 mmol/L — AB (ref 101–111)
CHLORIDE: 90 mmol/L — AB (ref 101–111)
CO2: 15 mmol/L — ABNORMAL LOW (ref 22–32)
CO2: 18 mmol/L — AB (ref 22–32)
CO2: 18 mmol/L — AB (ref 22–32)
CREATININE: 7.17 mg/dL — AB (ref 0.61–1.24)
CREATININE: 7.34 mg/dL — AB (ref 0.61–1.24)
CREATININE: 7.37 mg/dL — AB (ref 0.61–1.24)
Calcium: 6.6 mg/dL — ABNORMAL LOW (ref 8.9–10.3)
Chloride: 92 mmol/L — ABNORMAL LOW (ref 101–111)
GFR calc Af Amer: 8 mL/min — ABNORMAL LOW (ref >60)
GFR calc Af Amer: 8 mL/min — ABNORMAL LOW (ref >60)
GFR calc non Af Amer: 7 mL/min — ABNORMAL LOW (ref >60)
GFR, EST AFRICAN AMERICAN: 8 mL/min — AB (ref >60)
GFR, EST NON AFRICAN AMERICAN: 7 mL/min — AB (ref >60)
GFR, EST NON AFRICAN AMERICAN: 6 mL/min — AB (ref >60)
GLUCOSE: 219 mg/dL — AB (ref 65–99)
Glucose, Bld: 147 mg/dL — ABNORMAL HIGH (ref 65–99)
Glucose, Bld: 230 mg/dL — ABNORMAL HIGH (ref 65–99)
PHOSPHORUS: 10.5 mg/dL — AB (ref 2.5–4.6)
PHOSPHORUS: 9.5 mg/dL — AB (ref 2.5–4.6)
POTASSIUM: 6 mmol/L — AB (ref 3.5–5.1)
Phosphorus: 8 mg/dL — ABNORMAL HIGH (ref 2.5–4.6)
Potassium: 7 mmol/L (ref 3.5–5.1)
Potassium: 6.8 mmol/L (ref 3.5–5.1)
SODIUM: 128 mmol/L — AB (ref 135–145)
Sodium: 127 mmol/L — ABNORMAL LOW (ref 135–145)
Sodium: 129 mmol/L — ABNORMAL LOW (ref 135–145)

## 2015-04-03 LAB — PREPARE RBC (CROSSMATCH)

## 2015-04-03 LAB — MRSA PCR SCREENING: MRSA BY PCR: NEGATIVE

## 2015-04-03 MED ORDER — SODIUM CHLORIDE 0.9 % IV SOLN
1.0000 g | Freq: Once | INTRAVENOUS | Status: AC
  Administered 2015-04-03: 1 g via INTRAVENOUS
  Filled 2015-04-03: qty 10

## 2015-04-03 MED ORDER — SODIUM BICARBONATE 8.4 % IV SOLN
50.0000 meq | Freq: Once | INTRAVENOUS | Status: AC
  Administered 2015-04-03: 50 meq via INTRAVENOUS
  Filled 2015-04-03: qty 50

## 2015-04-03 MED ORDER — INSULIN ASPART 100 UNIT/ML ~~LOC~~ SOLN
10.0000 [IU] | Freq: Once | SUBCUTANEOUS | Status: AC
  Administered 2015-04-03: 10 [IU] via SUBCUTANEOUS
  Filled 2015-04-03: qty 10

## 2015-04-03 MED ORDER — DEXTROSE 50 % IV SOLN
50.0000 mL | Freq: Once | INTRAVENOUS | Status: AC
  Administered 2015-04-03: 50 mL via INTRAVENOUS
  Filled 2015-04-03: qty 50

## 2015-04-03 MED ORDER — LORAZEPAM 2 MG/ML PO CONC
1.0000 mg | ORAL | Status: DC | PRN
  Filled 2015-04-03: qty 0.5

## 2015-04-03 MED ORDER — LORAZEPAM 0.5 MG PO TABS: 1.0000 mg | ORAL_TABLET | ORAL | Status: DC | PRN

## 2015-04-03 MED ORDER — INSULIN ASPART 100 UNIT/ML IV SOLN
10.0000 [IU] | Freq: Once | INTRAVENOUS | Status: AC
  Administered 2015-04-03: 10 [IU] via INTRAVENOUS

## 2015-04-03 MED ORDER — SODIUM BICARBONATE 8.4 % IV SOLN
INTRAVENOUS | Status: AC
  Filled 2015-04-03: qty 50

## 2015-04-03 MED ORDER — LORAZEPAM 2 MG/ML IJ SOLN
2.0000 mg | INTRAMUSCULAR | Status: DC | PRN
  Filled 2015-04-03: qty 2

## 2015-04-03 MED ORDER — MORPHINE BOLUS VIA INFUSION
4.0000 mg | INTRAVENOUS | Status: DC | PRN
  Filled 2015-04-03: qty 4

## 2015-04-03 MED ORDER — DEXTROSE 50 % IV SOLN
INTRAVENOUS | Status: AC
  Administered 2015-04-03: 50 mL
  Filled 2015-04-03: qty 50

## 2015-04-03 MED ORDER — DEXMEDETOMIDINE HCL IN NACL 400 MCG/100ML IV SOLN
0.4000 ug/kg/h | INTRAVENOUS | Status: DC
  Administered 2015-04-03: 1.2 ug/kg/h via INTRAVENOUS
  Administered 2015-04-03: 0.4 ug/kg/h via INTRAVENOUS
  Filled 2015-04-03: qty 100

## 2015-04-03 MED ORDER — MORPHINE 100MG IN NS 100ML (1MG/ML) PREMIX INFUSION
5.0000 mg/h | INTRAVENOUS | Status: DC
  Administered 2015-04-03: 5 mg/h via INTRAVENOUS
  Filled 2015-04-03 (×2): qty 100

## 2015-04-03 MED ORDER — FUROSEMIDE 10 MG/ML IJ SOLN
80.0000 mg | Freq: Once | INTRAMUSCULAR | Status: AC
  Administered 2015-04-03: 80 mg via INTRAVENOUS
  Filled 2015-04-03: qty 8

## 2015-04-03 MED ORDER — DEXTROSE 50 % IV SOLN
25.0000 mL | Freq: Once | INTRAVENOUS | Status: AC
  Administered 2015-04-03: 25 mL via INTRAVENOUS
  Filled 2015-04-03: qty 50

## 2015-04-03 MED ORDER — LORAZEPAM 2 MG/ML IJ SOLN
4.0000 mg | Freq: Once | INTRAMUSCULAR | Status: AC
  Administered 2015-04-03: 4 mg via INTRAVENOUS
  Filled 2015-04-03: qty 2

## 2015-04-03 MED ORDER — SODIUM CHLORIDE 0.9 % IV SOLN
Freq: Once | INTRAVENOUS | Status: DC
Start: 2015-04-03 — End: 2015-04-03

## 2015-04-03 MED ORDER — MORPHINE SULFATE (PF) 2 MG/ML IV SOLN
2.0000 mg | INTRAVENOUS | Status: DC | PRN
  Administered 2015-04-03: 2 mg via INTRAVENOUS

## 2015-04-03 MED ORDER — ALBUTEROL SULFATE (2.5 MG/3ML) 0.083% IN NEBU
10.0000 mg | INHALATION_SOLUTION | Freq: Once | RESPIRATORY_TRACT | Status: AC
  Administered 2015-04-03: 10 mg via RESPIRATORY_TRACT
  Filled 2015-04-03: qty 12

## 2015-04-04 ENCOUNTER — Ambulatory Visit: Payer: Medicare Other | Admitting: Internal Medicine

## 2015-04-04 LAB — URINE CULTURE: CULTURE: NO GROWTH

## 2015-04-04 LAB — TYPE AND SCREEN
ABO/RH(D): O NEG
Antibody Screen: NEGATIVE
UNIT DIVISION: 0
UNIT DIVISION: 0
Unit division: 0
Unit division: 0

## 2015-04-06 LAB — LACTATE DEHYDROGENASE: LDH: 2790 U/L — ABNORMAL HIGH (ref 98–192)

## 2015-04-07 LAB — CULTURE, BLOOD (ROUTINE X 2)

## 2015-04-27 ENCOUNTER — Other Ambulatory Visit: Payer: Self-pay | Admitting: Nurse Practitioner

## 2015-04-30 NOTE — Progress Notes (Signed)
Patient with increased SOB, he is attempting to pull bi-pap off. Dr. Bridgett Larsson called and I requested morphine change to every 2 hours. Mittens applied. Requested order Dr. Rogelia Mire for a Precedx drip and new order received.

## 2015-04-30 NOTE — Progress Notes (Signed)
[facility] [place] NAME: Dustin Frazier    MR#:  [id number]  DATE OF BIRTH:  08/14/1940  SUBJECTIVE:  CHIEF COMPLAINT:   Chief Complaint  Patient presents with  . Shortness of Breath    prostate ca with lung nodules   The pt is on BIPAP, awake but confused. Tachy at 150. Oliguria. REVIEW OF SYSTEMS:  Unable to obtain.  DRUG ALLERGIES:  No Known Allergies  VITALS:  Blood pressure 152/65, pulse 120, temperature 99.9 F (37.7 C), temperature source Axillary, resp. rate 12, height '5\' 10"'$  (1.778 m), weight 84.4 kg (186 lb 1.1 oz), SpO2 100 %.  PHYSICAL EXAMINATION:  GENERAL:  75 y.o.-year-old patient lying in the bed, on BIPAP. EYES: Pupils equal, round, reactive to light and accommodation. No scleral icterus. Extraocular muscles intact.  HEENT: Head atraumatic, normocephalic.   NECK:  Supple, no jugular venous distention. No thyroid enlargement, no tenderness.  LUNGS: Normal breath sounds bilaterally, no wheezing, but has crackles and rhonchi. And use of accessory muscles of respiration.  CARDIOVASCULAR: S1, S2 tachy. No murmurs, rubs, or gallops.  ABDOMEN: Soft, nontender, nondistended. Bowel sounds present. No organomegaly or mass.  EXTREMITIES: b/l leg edema, no cyanosis, or clubbing.  NEUROLOGIC: unable to exam. PSYCHIATRIC: The patient is awake but confused.  SKIN: No obvious rash, lesion, or ulcer. But has bruises.   LABORATORY PANEL:   CBC  Recent Labs Lab April 23, 2015 0651  WBC 4.0  HGB 6.3*  HCT 18.2*  PLT 18*   ------------------------------------------------------------------------------------------------------------------  Chemistries   Recent Labs Lab 04/29/2015 1351  2015-04-23 0317 04/23/15 0651  NA 125*  < > 128*  --   K 6.7*  < > 6.8*  --   CL 89*  < > 92*  --   CO2 14*  < > 18*  --   GLUCOSE 108*  < > 219*  --   BUN 144*  < > 156*  --   CREATININE 7.13*  < > 7.34*  --   CALCIUM 7.3*  < > 6.9*   --   MG  --   < > 2.1 2.1  AST 49*  --   --   --   ALT 50  --   --   --   ALKPHOS 170*  --   --   --   BILITOT 1.4*  --   --   --   < > = values in this interval not displayed. ------------------------------------------------------------------------------------------------------------------  Cardiac Enzymes No results for input(s): TROPONINI in the last 168 hours. ------------------------------------------------------------------------------------------------------------------  RADIOLOGY:  Dg Chest 1 View  03/31/2015  CLINICAL DATA:  End-stage prostate cancer, productive cough. EXAM: CHEST 1 VIEW COMPARISON:  Chest x-rays dated 11/22/2014 and 11/03/2014. Comparison also made to chest CT dated 02/23/2015 and nuclear medicine bone scan dated 03/30/2015. FINDINGS: Heart size is upper normal, unchanged. Overall cardiomediastinal silhouette is stable in size and configuration. There is a stable left perihilar opacity, compatible with the irregular mass described on recent chest CT. There is a new subtle airspace opacity within the lateral aspects of the right upper lobe, suggesting atelectasis or developing pneumonia. Small left pleural effusion is unchanged. The diffuse osseous metastases are stable in the short-term interval. IMPRESSION: 1. New subtle airspace opacity within the peripheral aspects of the right upper lobe which could represent focal atelectasis or early developing pneumonia. 2. Otherwise stable chest x-ray, as detailed above. Electronically Signed   By: Roxy Horseman.D.  On: 04/04/2015 15:21   Dg Chest Portable 1 View  04/14/2015  CLINICAL DATA:  Central line placement.  Initial encounter. EXAM: PORTABLE CHEST 1 VIEW COMPARISON:  Chest radiograph performed earlier today at 2:28 p.m. FINDINGS: The patient's right IJ line is noted ending about the distal SVC. Scattered densities about the lung fields are thought to reflect overlying metastatic lesions within the ribs. No definite  focal airspace consolidation is seen. No pleural effusion or pneumothorax identified. The cardiomediastinal silhouette is normal in size. Numerous sclerotic metastatic lesions are again noted throughout the visualized osseous structures. IMPRESSION: 1. Right IJ line noted ending about the distal SVC. 2. No definite focal airspace consolidation seen. 3. Numerous sclerotic metastatic lesions again noted throughout the visualized osseous structures. Electronically Signed   By: Garald Balding M.D.   On: 04/06/2015 18:43    EKG:   Orders placed or performed during the hospital encounter of 11/21/14  . EKG 12-Lead  . EKG 12-Lead    ASSESSMENT AND PLAN:   #1 acute renal failure. Worsening. Continue NS iv, f/u nephrology. But no HD per patient, daughter and  Dr. Aletha Halim discussion.   #2 hyperkalemia-is likely result of the patient's acute renal failure. K still high at 6.8, give D50, insulin and calcium gluconate and follow potassium level.   #3 anemia-this is acute on chronic anemia and likely related to his chemotherapy.  S/p PRBC, still low hemoglobin at 6.3. Discussed with pt's daughter, she wants transfusion will give 2 unit PRBC.  F/u Hb.   #4 thrombocytopenia-also chronic and probably related to his chemotherapy and underlying malignancy. Still low.  #5 refractory prostate cancer-continue pain control with as needed morphine.  consult palliative care to discuss goals of care.  #6 Acute respiratory failure with COPD exacerbation. On BIPAP, continue IV steroids, DuoNeb nebs.  #7 BPH-continue doxazosin.  Hyponatremia. NS iv, f/u BMP Lactic acidosis. Continue IVF.    High risk for pulmonary and cardiac arrest. Discussed with Dr. Rogue Bussing. The patient needs palliative care. All the records are reviewed and case discussed with Care Management/Social Workerr. Management plans discussed with the patient, his daughter and they are in agreement.  CODE STATUS: DNR  TOTAL  CRITICAL  TIME TAKING CARE OF THIS PATIENT: 52 minutes.  Greater than 50% time was spent on coordination of care and face-to-face counseling.  POSSIBLE D/C IN ? DAYS, DEPENDING ON CLINICAL CONDITION.   Demetrios Loll M.D on 05-02-2015 at 8:31 AM  Between 7am to 6pm - Pager - [phone]  After 6pm go to [email or web address] - password [email or web address] [facility]  Office  [phone]  CC: Primary care physician; [name], MD

## 2015-04-30 NOTE — Progress Notes (Signed)
[facility]  ROUNDING NOTE   Subjective:   Critically ill. On BiPap. Family at bedside.  No CRRT. Continues on bicarb gtt and getting calcium gluconate.  Anuric with UOP 100 Hyperkalemia, hyponatremia, metabolic acidosis, severe uremia, thrombocytopenia and hypocalcemia.   Sinus tachycardia.  Status post 1 unit PRBC  Objective:  Vital signs in last 24 hours:  Temp:  [97.2 F (36.2 C)-99.9 F (37.7 C)] 99.9 F (37.7 C) (12/05 0600) Pulse Rate:  [52-150] 150 (12/05 0843) Resp:  [12-27] 14 (12/05 0843) BP: (95-160)/(56-91) 152/65 mmHg (12/05 0600) SpO2:  [77 %-100 %] 100 % (12/05 0843) FiO2 (%):  [50 %] 50 % (12/05 0841) Weight:  [74.844 kg (165 lb)-84.4 kg (186 lb 1.1 oz)] 84.4 kg (186 lb 1.1 oz) (12/05 0635)  Weight change:  Filed Weights   04/12/2015 1334 04/07/2015 1403 2015-04-28 0635  Weight: 74.844 kg (165 lb) 81.5 kg (179 lb 10.8 oz) 84.4 kg (186 lb 1.1 oz)    Intake/Output: I/O last 3 completed shifts: In: 2310 [I.V.:1690; Blood:400; IV Piggyback:220] Out: 100 [Urine:100]   Intake/Output this shift:     Physical Exam: General: Critically ill, +BIPAP  Head: Normocephalic, atraumatic.  Eyes: Anicteric, PERRL  Neck: rij central line  Lungs:  Bilateral rhonchi  Heart: +tachycardia  Abdomen:  Soft, nontender, obese  Extremities: 1+ peripheral edema.  Neurologic: Nonfocal, moving all four extremities  Skin: No lesions  Access: none    Basic Metabolic Panel:  Recent Labs Lab 03/28/15 0827 04/04/2015 1351 03/31/2015 1900 04/04/2015 1926 04/27/2015 2007 04/05/2015 2329 April 28, 2015 0317 04/28/2015 0651  NA 137 125*  --  126*  --  127* 128*  --   K 3.3* 6.7*  --  7.4*  --  7.0* 6.8*  --   CL 100* 89*  --  94*  --  93* 92*  --   CO2 21* 14*  --  12*  --  15* 18*  --   GLUCOSE 263* 108*  --  78  --  147* 219*  --   BUN 14 144*  --  109*  --  153* 156*  --   CREATININE 0.89 7.13*  --  6.99*  --  7.17* 7.34*  --   CALCIUM 8.9 7.3*  --  6.9*  --  7.2* 6.9*  --    MG  --   --  2.3  --  2.3  --  2.1 2.1  PHOS  --   --   --  9.4*  --  10.5* 9.5*  --     Liver Function Tests:  Recent Labs Lab 03/28/15 0827 04/25/2015 1351 04/14/2015 1926 04/04/2015 2329 04-28-15 0317  AST 48* 49*  --   --   --   ALT 33 50  --   --   --   ALKPHOS 131* 170*  --   --   --   BILITOT 0.5 1.4*  --   --   --   PROT 6.4* 5.1*  --   --   --   ALBUMIN 3.4* 2.2* 2.0* 2.0* 2.0*   No results for input(s): LIPASE, AMYLASE in the last 168 hours. No results for input(s): AMMONIA in the last 168 hours.  CBC:  Recent Labs Lab 03/28/15 0827 04/01/2015 1351 04/28/15 0651  WBC 10.1 10.5 4.0  NEUTROABS 9.4* 9.0*  --   HGB 9.9* 5.2* 6.3*  HCT 29.7* 15.3* 18.2*  MCV 89.0 89.5 85.0  PLT 41* 19* 18*  Cardiac Enzymes:  Recent Labs Lab 04/07/2015 1926  CKTOTAL 63    BNP: Invalid input(s): POCBNP  CBG: No results for input(s): GLUCAP in the last 168 hours.  Microbiology: Results for orders placed or performed during the hospital encounter of 04/01/2015  Culture, blood (routine x 2)     Status: None (Preliminary result)   Collection Time: 04/01/2015  1:51 PM  Result Value Ref Range Status   Specimen Description BLOOD LEFT AC  Final   Special Requests BOTTLES DRAWN AEROBIC AND ANAEROBIC  1CC  Final   Culture NO GROWTH < 24 HOURS  Final   Report Status PENDING  Incomplete  Culture, blood (routine x 2)     Status: None (Preliminary result)   Collection Time: 03/30/2015  4:42 PM  Result Value Ref Range Status   Specimen Description BLOOD RIGHT HAND  Final   Special Requests BOTTLES DRAWN AEROBIC AND ANAEROBIC  1CC  Final   Culture NO GROWTH < 24 HOURS  Final   Report Status PENDING  Incomplete    Coagulation Studies:  Recent Labs  04/19/2015 2007  LABPROT 17.8*  INR 1.46    Urinalysis:  Recent Labs  04/22/2015 1936  COLORURINE YELLOW*  LABSPEC 1.011  PHURINE 5.0  GLUCOSEU 50*  HGBUR 3+*  BILIRUBINUR NEGATIVE  KETONESUR NEGATIVE  PROTEINUR 30*  NITRITE  NEGATIVE  LEUKOCYTESUR NEGATIVE      Imaging: Dg Chest 1 View  04/27/2015  CLINICAL DATA:  End-stage prostate cancer, productive cough. EXAM: CHEST 1 VIEW COMPARISON:  Chest x-rays dated 11/22/2014 and 11/03/2014. Comparison also made to chest CT dated 02/23/2015 and nuclear medicine bone scan dated 03/30/2015. FINDINGS: Heart size is upper normal, unchanged. Overall cardiomediastinal silhouette is stable in size and configuration. There is a stable left perihilar opacity, compatible with the irregular mass described on recent chest CT. There is a new subtle airspace opacity within the lateral aspects of the right upper lobe, suggesting atelectasis or developing pneumonia. Small left pleural effusion is unchanged. The diffuse osseous metastases are stable in the short-term interval. IMPRESSION: 1. New subtle airspace opacity within the peripheral aspects of the right upper lobe which could represent focal atelectasis or early developing pneumonia. 2. Otherwise stable chest x-ray, as detailed above. Electronically Signed   By: Franki Cabot M.D.   On: 04/01/2015 15:21   Dg Chest Portable 1 View  04/05/2015  CLINICAL DATA:  Central line placement.  Initial encounter. EXAM: PORTABLE CHEST 1 VIEW COMPARISON:  Chest radiograph performed earlier today at 2:28 p.m. FINDINGS: The patient's right IJ line is noted ending about the distal SVC. Scattered densities about the lung fields are thought to reflect overlying metastatic lesions within the ribs. No definite focal airspace consolidation is seen. No pleural effusion or pneumothorax identified. The cardiomediastinal silhouette is normal in size. Numerous sclerotic metastatic lesions are again noted throughout the visualized osseous structures. IMPRESSION: 1. Right IJ line noted ending about the distal SVC. 2. No definite focal airspace consolidation seen. 3. Numerous sclerotic metastatic lesions again noted throughout the visualized osseous structures.  Electronically Signed   By: Garald Balding M.D.   On: 04/07/2015 18:43     Medications:   . sodium chloride    . sodium chloride 100 mL/hr at 04/06/2015 0400  . pureflow    .  sodium bicarbonate  infusion 1000 mL 150 mL/hr at 04/22/2015 2120   . sodium chloride  10 mL/hr Intravenous Once  . sodium chloride   Intravenous Once  .  calcium carbonate  400 mg of elemental calcium Oral BID  . calcium gluconate  1 g Intravenous Once  . diltiazem  10 mg Intravenous Once  . doxazosin  2 mg Oral QHS  . ferrous sulfate  325 mg Oral BID WC  . insulin aspart  10 Units Subcutaneous Once  . ipratropium-albuterol  3 mL Nebulization Q4H  . methylPREDNISolone (SOLU-MEDROL) injection  40 mg Intravenous Q6H  . multivitamin with minerals  1 tablet Oral Daily  . sodium bicarbonate      . sodium polystyrene  30 g Oral Once  . sucralfate  1 g Oral TID WC & HS   acetaminophen **OR** acetaminophen, heparin, morphine injection, morphine injection, ondansetron **OR** ondansetron (ZOFRAN) IV  Assessment/ Plan:  Dustin Frazier is a 75 y.o. 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 Acute renal failure: oliguric. With hyponatremia, hyperkalemia, metabolic acidosis, hypocalcemia, severe uremia. Unfortunately, patient is critically ill and the next aggressive step with be renal replacement therapy.  However discussion with daughter and family, patient does not want dialysis. Patient is DNR but not ready for hospice.  Critically ill and situation is guarded. Will continue to follow along - Continue bicarb gtt - continue calcium gluconate.  - Continue tums  2. Acute respiratory failure: on BiPap  3. Severe thrombocytopenia: appreciate hematology input. Concerning for  microangiopathy hemolytic anemia.     LOS: Dustin Frazier, Dustin Frazier 2017/01/019:03 AM

## 2015-04-30 NOTE — Progress Notes (Signed)
Nutrition Brief Note  Chart reviewed. Pt triggered for assessment due to MST screen.  Pt now transitioning to comfort care.  No further nutrition interventions warranted at this time.  Please re-consult as needed.   Kerman Passey [place], [place], LDN [phone] Pager

## 2015-04-30 NOTE — H&P (Signed)
[facility] Consultation     ASSESSMENT/PLAN   This is a 75 year old male admitted with acute respiratory failure, acute, severe metabolic acidosis and acute renal failure with underlying end-stage prostate cancer.  Acute metabolic acidosis. -Likely multifactorial from acute renal failure and sepsis.  Acute renal failure. -With severe hyperkalemia-metabolic acidosis-uremia and volume overload.  Prostate cancer. -Metastatic, refractory to therapy, now appears end-stage.  Septic shock. -Uncertain etiology.  Acute respiratory failure. -Etiology includes possible volume overload from renal failure, COPD exacerbation, pulmonary embolism, pneumonia.  I had a discussion with the family at the bedside. They believe that he would not want further therapies and would want to pass away naturally. The family is given further opportunities to speak and cranky with the patient subsequently will be placed on a comfort care order set with IV medications to better control his breathing. Subsequently, he will be titrated off BiPAP to 2 L nasal cannula and potentially transfer to the floor if possible. Family understands that he will most likely pass away after the patient is taken off BiPAP and we will do our utmost to maintain his comfort during that process. ---------------------------------------  ---------------------------------------   Name: Dustin Frazier MRN: [id number] DOB: 06/22/1940    ADMISSION DATE:  04/21/2015 CONSULTATION DATE:  05/01/15.  REFERRING MD :  Dr. Bridgett Larsson.   CHIEF COMPLAINT: Dyspnea.    HISTORY OF PRESENT ILLNESS:    The patient is currently on full facial BiPAP, and is not responsive. Therefore, he is not able to provide history, history was obtained from the chart and from staff. Per the H&P: Dustin Frazier is a 75 y.o. male with a known history of refractory prostate cancer, COPD, chronic thrombocytopenia, hypertension, who presents to  the hospital due to shortness of breath and also cough congestion. Patient's daughter says that he was doing well until 2 days ago he is to become more weak and shortness of breath even on minimal exertion has gotten significantly worse. He admits to a cough with productive sputum which is now green yellow in color. He denies any fever, nausea, vomiting but does admit to poor by mouth intake. Since his symptoms were not improving he was brought to the ER for further evaluation and was noted to be anemic with hemoglobin of 5.5 and his last hemoglobin being 9. He was also noted to be in acute renal failure with hyperkalemia. Hospitalist services were contacted further treatment and evaluation.  The patient has apparently been decided to stop chemotherapy, and has been considering hospice care. The patient's daughters are at the bedside and tell me that he is DO NOT RESUSCITATE and he does not want to be kept alive on machines. Subsequently, since his arrival. He is been diagnosed with sepsis with elevated lactic acid, and acute renal failure. His respiratory status declined and he was transitioned to high flow nasal cannula. Initially he was seen by the nephrology service and it was thought that he might be a candidate for continual renal replacement therapy, however, his status deteriorated further and his family opted not to escalate his care further. His respiratory status continued to decline, he was transitioned to BiPAP and his mental status has declined further. I was consulted regarding possible change to comfort measures as the family has opted not to continue to pursue other options. Currently the patient is on full facial BiPAP. He appears to be in significant respiratory distress. His breathing heavily, using accessory muscles, he is not able to talk due  to severe dyspnea.  PAST MEDICAL HISTORY :  Past Medical History  Diagnosis Date  . Prostate cancer metastatic to bone ([place]) 04/2006  .  Pneumonia   . Arthritis   . Anemia   . Thrombocytopenia ([place])   . Vertigo    Past Surgical History  Procedure Laterality Date  . Back surgery    . Cervical disc surgery    . Kidney stone    . Nasal septum surgery    . Endobronchial ultrasound N/A 11/22/2014    Procedure: ENDOBRONCHIAL ULTRASOUND;  Surgeon: Vilinda Boehringer, MD;  Location: [facility] ORS;  Service: Cardiopulmonary;  Laterality: N/A;   Prior to Admission medications   Medication Sig Start Date End Date Taking? Authorizing Provider  albuterol (PROVENTIL HFA;VENTOLIN HFA) 108 (90 BASE) MCG/ACT inhaler Inhale 2 puffs into the lungs every 6 (six) hours as needed for wheezing or shortness of breath. Or with cough 03/07/15  Yes Cammie Sickle, MD  amoxicillin (AMOXIL) 500 MG capsule Take 1 capsule by mouth 3 (three) times daily. 03/22/15  Yes Historical Provider, MD  dexamethasone (DECADRON) 4 MG tablet Take 2 tablets twice a day the day before chemo, the day of chemo and day after chemo with each treatment 02/14/15  Yes Cammie Sickle, MD  furosemide (LASIX) 20 MG tablet Once a day as needed Or if leg swelling significantly worse- can take up to 2 pills a day 03/07/15  Yes Cammie Sickle, MD  naproxen (NAPROSYN) 500 MG tablet Take 1 tablet (500 mg total) by mouth 2 (two) times daily with a meal. 03/28/15  Yes Cammie Sickle, MD  predniSONE (DELTASONE) 5 MG tablet Take 1 tablet (5 mg total) by mouth 2 (two) times daily. 12/19/14  Yes Leia Alf, MD  calcium carbonate (OSCAL) 1500 (600 CA) MG TABS tablet Take 600 mg of elemental calcium by mouth 2 (two) times daily with a meal.    Historical Provider, MD  chlorhexidine (PERIDEX) 0.12 % solution 1 mL by Mouth Rinse route at bedtime. 09/14/14   Historical Provider, MD  doxazosin (CARDURA) 2 MG tablet Take 2 mg by mouth at bedtime.     Historical Provider, MD  ferrous sulfate 325 (65 FE) MG tablet Take 325 mg by mouth 2 (two) times daily with a meal.    Historical  Provider, MD  leuprolide (LUPRON) 11.25 MG KIT injection Inject 1 each into the muscle every 3 (three) months.    Historical Provider, MD  meclizine (ANTIVERT) 25 MG tablet TAKE ONE TABLET BY MOUTH THREE TIMES DAILY AS NEEDED FOR DIZZINESS 02/08/15   Cammie Sickle, MD  metroNIDAZOLE (FLAGYL) 500 MG tablet Take 1 tablet by mouth 3 (three) times daily.  03/22/15   Historical Provider, MD  Multiple Vitamin (MULTI-VITAMINS) TABS Take 1 tablet by mouth daily.    Historical Provider, MD  oxyCODONE (OXY IR/ROXICODONE) 5 MG immediate release tablet Take 1 tablet (5 mg total) by mouth 5 (five) times daily as needed for severe pain. 03/02/15 04/01/15  Milinda Pointer, MD  sucralfate (CARAFATE) 1 GM/10ML suspension Take 10 mLs (1 g total) by mouth 4 (four) times daily -  with meals and at bedtime. 01/26/15   Leia Alf, MD   No Known Allergies  FAMILY HISTORY:  Family History  Problem Relation Age of Onset  . Heart attack Father 66    death  . Diabetes Sister   . COPD Sister   . Emphysema Sister   . Heart Problems Sister   .  Diabetes Brother   . Heart Problems Brother   . Liver cancer Brother    SOCIAL HISTORY:  reports that he has quit smoking. His smoking use included Cigarettes. He has a 30 pack-year smoking history. He has never used smokeless tobacco. He reports that he drinks alcohol. He reports that he does not use illicit drugs.  REVIEW OF SYSTEMS:     VITAL SIGNS: Temp:  [97.2 F (36.2 C)-99.9 F (37.7 C)] 99.9 F (37.7 C) (12/05 0600) Pulse Rate:  [52-150] 150 (12/05 0843) Resp:  [12-27] 14 (12/05 0843) BP: (95-160)/(56-91) 152/65 mmHg (12/05 0600) SpO2:  [77 %-100 %] 100 % (12/05 0843) FiO2 (%):  [50 %] 50 % (12/05 0841) Weight:  [74.844 kg (165 lb)-84.4 kg (186 lb 1.1 oz)] 84.4 kg (186 lb 1.1 oz) (12/05 0635) HEMODYNAMICS: CVP:  [7 mmHg-16 mmHg] 14 mmHg VENTILATOR SETTINGS: Vent Mode:  [-]  FiO2 (%):  [50 %] 50 % INTAKE / OUTPUT:  Intake/Output Summary (Last  24 hours) at 2015-04-23 1140 Last data filed at 23-Apr-2015 0600  Gross per 24 hour  Intake   2310 ml  Output    100 ml  Net   2210 ml    Physical Examination:   VS: BP 152/65 mmHg  Pulse 150  Temp(Src) 99.9 F (37.7 C) (Axillary)  Resp 14  Ht _0  (1.778 m)  Wt 84.4 kg (186 lb 1.1 oz)  BMI 26.70 kg/m2  SpO2 100%  General Appearance:  in respiratory distress Neuro:without focal findings,  HEENT: PERRLA, EOM intact,  Pulmonary: normal breath sounds.,But decreased bilaterally. vascularNormal S1,S2.  No m/r/g.    Abdomen: Benign, Soft, non-tender, No masses,  Renal:  No costovertebral tenderness  GU:  Not performed at this time. Endoc: No evident thyromegaly, no signs of acromegaly. Skin:   warm, no rashes, no ecchymosis  Extremities: normal, no cyanosis, clubbing, no edema, warm with normal capillary refill.    LABS: Reviewed   LABORATORY PANEL:   CBC  Recent Labs Lab April 23, 2015 0651  WBC 4.0  HGB 6.3*  HCT 18.2*  PLT 18*    Chemistries   Recent Labs Lab 03/31/2015 1351  2015/04/23 0651  NA 125*  < > 129*  K 6.7*  < > 6.0*  CL 89*  < > 90*  CO2 14*  < > 18*  GLUCOSE 108*  < > 230*  BUN 144*  < > 160*  CREATININE 7.13*  < > 7.37*  CALCIUM 7.3*  < > 6.6*  MG  --   < > 2.1  PHOS  --   < > 8.0*  AST 49*  --   --   ALT 50  --   --   ALKPHOS 170*  --   --   BILITOT 1.4*  --   --   < > = values in this interval not displayed.  No results for input(s): GLUCAP in the last 168 hours.  Recent Labs Lab 04/18/2015 1644 04/25/2015 1900  PHART 7.37 7.32*  PCO2ART 21* 19*  PO2ART 150* 144*    Recent Labs Lab 03/28/15 0827 04/09/2015 1351  03/30/2015 2329 04/23/15 0317 04/23/15 0651  AST 48* 49*  --   --   --   --   ALT 33 50  --   --   --   --   ALKPHOS 131* 170*  --   --   --   --   BILITOT 0.5 1.4*  --   --   --   --  ALBUMIN 3.4* 2.2*  < > 2.0* 2.0* 1.8*  < > = values in this interval not displayed.  Cardiac Enzymes No results for input(s): TROPONINI  in the last 168 hours.  RADIOLOGY:  Dg Chest 1 View  03/30/2015  CLINICAL DATA:  End-stage prostate cancer, productive cough. EXAM: CHEST 1 VIEW COMPARISON:  Chest x-rays dated 11/22/2014 and 11/03/2014. Comparison also made to chest CT dated 02/23/2015 and nuclear medicine bone scan dated 03/30/2015. FINDINGS: Heart size is upper normal, unchanged. Overall cardiomediastinal silhouette is stable in size and configuration. There is a stable left perihilar opacity, compatible with the irregular mass described on recent chest CT. There is a new subtle airspace opacity within the lateral aspects of the right upper lobe, suggesting atelectasis or developing pneumonia. Small left pleural effusion is unchanged. The diffuse osseous metastases are stable in the short-term interval. IMPRESSION: 1. New subtle airspace opacity within the peripheral aspects of the right upper lobe which could represent focal atelectasis or early developing pneumonia. 2. Otherwise stable chest x-ray, as detailed above. Electronically Signed   By: Franki Cabot M.D.   On: 04/01/2015 15:21   Dg Chest Portable 1 View  04/27/2015  CLINICAL DATA:  Central line placement.  Initial encounter. EXAM: PORTABLE CHEST 1 VIEW COMPARISON:  Chest radiograph performed earlier today at 2:28 p.m. FINDINGS: The patient's right IJ line is noted ending about the distal SVC. Scattered densities about the lung fields are thought to reflect overlying metastatic lesions within the ribs. No definite focal airspace consolidation is seen. No pleural effusion or pneumothorax identified. The cardiomediastinal silhouette is normal in size. Numerous sclerotic metastatic lesions are again noted throughout the visualized osseous structures. IMPRESSION: 1. Right IJ line noted ending about the distal SVC. 2. No definite focal airspace consolidation seen. 3. Numerous sclerotic metastatic lesions again noted throughout the visualized osseous structures. Electronically Signed    By: Garald Balding M.D.   On: 04/18/2015 18:43       --Marda Stalker, MD.  Board Certified in Internal Medicine, Pulmonary Medicine, [place], and Sleep Medicine.  Pager [phone] [facility] Office Number: [phone]  Patricia Pesa, M.D.  Vilinda Boehringer, M.D.  Merton Border, M.D   04/13/15, 11:40 AM  Critical Care Attestation.  I have personally obtained a history, examined the patient, evaluated laboratory and imaging results, formulated the assessment and plan and placed orders. I discussed the case with the patient's family members at bedside as well as a critical care RN, critical care pharmacist, respiratory therapy. The Patient requires high complexity decision making for assessment and support, frequent evaluation and titration of therapies, application of advanced monitoring technologies and extensive interpretation of multiple databases. The patient has critical illness that could lead imminently to failure of 1 or more organ systems and requires the highest level of physician preparedness to intervene.  Critical Care Time devoted to patient care services described in this note is 35 minutes and is exclusive of time spent in procedures.

## 2015-04-30 NOTE — Progress Notes (Signed)
Palliative Care Update   I am personally out of the hospital today, but Atha Starks, Social Worker with [facility], has been able to assist in my absence. She attended rounds this am and then contacted me with an update as to the plan of care for pt.  Comfort care orders are being initiated by Critical Care physician.    I will return tomorrow and will see what the patient's needs are at that time. In the meantime, Atha Starks is available, if needed, for supportive conversations.    Colleen Can, MD

## 2015-04-30 NOTE — Progress Notes (Signed)
Dr. Geryl Councilman and Dr. Tish Men in room and talked with patient's daughter regarding his poor prognosis, lab results and are aware of elevated heart rate. Palliative Care aconsult as well as comfort care discussed. Heart rate elevated when he was talking to MDs and daughter.

## 2015-04-30 NOTE — Progress Notes (Signed)
Patient is not breathing. No heart beat present upon auscultation by myself or Dannielle Huh, Therapist, sports.  Patient asystole on the monitor. Dr. Rogelia Mire notified of patient's death.

## 2015-04-30 NOTE — Progress Notes (Signed)
Dustin Frazier   DOB:04/26/1941   YN#:[id number]    Subjective: Patient is on BiPAP/ hence hard to communicate. Daughter by the bedside.  Overnight patient's condition as expectedly worsened. He is currently needing BiPAP. He has episodes of pauses on the EKG/ also tachycardia.  He has needed management of hyperkalemia overnight.  ROS: Difficult to assess given the patient's clinical status.  Objective:  Filed Vitals:   04/29/2015 0600 April 29, 2015 0843  BP: 152/65   Pulse: 120 150  Temp: 99.9 F (37.7 C)   Resp: 12 14     Intake/Output Summary (Last 24 hours) at 04/29/2015 0901 Last data filed at 04-29-2015 0600  Gross per 24 hour  Intake   2310 ml  Output    100 ml  Net   2210 ml    GENERAL: Intermittently alert/mostly drowsy. Continues. He is on BiPAP.    Labs:  Lab Results  Component Value Date   WBC 4.0 29-Apr-2015   HGB 6.3* April 29, 2015   HCT 18.2* 29-Apr-2015   MCV 85.0 2015-04-29   PLT 18* 04-29-2015   NEUTROABS 9.0* 04/29/2015    Lab Results  Component Value Date   NA 128* 04-29-15   K 6.8* 29-Apr-2015   CL 92* 2015-04-29   CO2 18* Apr 29, 2015    Studies:  Dg Chest 1 View  04/25/2015  CLINICAL DATA:  End-stage prostate cancer, productive cough. EXAM: CHEST 1 VIEW COMPARISON:  Chest x-rays dated 11/22/2014 and 11/03/2014. Comparison also made to chest CT dated 02/23/2015 and nuclear medicine bone scan dated 03/30/2015. FINDINGS: Heart size is upper normal, unchanged. Overall cardiomediastinal silhouette is stable in size and configuration. There is a stable left perihilar opacity, compatible with the irregular mass described on recent chest CT. There is a new subtle airspace opacity within the lateral aspects of the right upper lobe, suggesting atelectasis or developing pneumonia. Small left pleural effusion is unchanged. The diffuse osseous metastases are stable in the short-term interval. IMPRESSION: 1. New subtle airspace opacity within the peripheral aspects of the  right upper lobe which could represent focal atelectasis or early developing pneumonia. 2. Otherwise stable chest x-ray, as detailed above. Electronically Signed   By: Franki Cabot M.D.   On: 04/01/2015 15:21   Dg Chest Portable 1 View  04/20/2015  CLINICAL DATA:  Central line placement.  Initial encounter. EXAM: PORTABLE CHEST 1 VIEW COMPARISON:  Chest radiograph performed earlier today at 2:28 p.m. FINDINGS: The patient's right IJ line is noted ending about the distal SVC. Scattered densities about the lung fields are thought to reflect overlying metastatic lesions within the ribs. No definite focal airspace consolidation is seen. No pleural effusion or pneumothorax identified. The cardiomediastinal silhouette is normal in size. Numerous sclerotic metastatic lesions are again noted throughout the visualized osseous structures. IMPRESSION: 1. Right IJ line noted ending about the distal SVC. 2. No definite focal airspace consolidation seen. 3. Numerous sclerotic metastatic lesions again noted throughout the visualized osseous structures. Electronically Signed   By: Garald Balding M.D.   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   Recommendations:  # I recommend continued symptom management rather than any aggressive measures like intubation  or dialysis. I do not think aggressive measures are going to be beneficial/improving his survival chances.  # I discussed with Dr. Bridgett Larsson; he agrees. Recommend palliative care evaluation for symptom management.  # Discussed with the patient's daughter Lattie Haw-  that unfortunately patient is dying; and I do not think patient will be able to go home/or even hospice home at this time.  All questions were answered. 65mnutes face-to-face spent with the patient and family with more than 50% time spent on discussing the overall poor prognosis/DNR/DNI/and not escalating the level of care.   GCammie Sickle MD [id number] 9:01 AM

## 2015-04-30 NOTE — Progress Notes (Signed)
Dr. Lavetta Nielsen informed of renal function results.

## 2015-04-30 NOTE — Progress Notes (Signed)
Bi-pap off and patient on 2 liter  per order. Morphine and Precedex drips continue.

## 2015-04-30 NOTE — Progress Notes (Signed)
PRN morpine given due to shortness of breath and elevated heart rate

## 2015-04-30 NOTE — Progress Notes (Signed)
CR[facility] medication consult  All meds reviewed. No changes recommended at this time.  Sim Boast, PharmD, BCPS  24-Apr-2015   .

## 2015-04-30 NOTE — Progress Notes (Signed)
Dr. Marcille Blanco notified of Renal function level

## 2015-04-30 NOTE — Discharge Summary (Signed)
[place] [facility] [place] NAME: Dustin Frazier    MR#:  [id number]  DATE OF BIRTH:  1941/01/02  DATE OF ADMISSION:  04/28/2015 ADMITTING PHYSICIAN: Henreitta Leber, MD  DATE OF EXPIRATION: 04-15-2015 PRIMARY CARE PHYSICIAN: [name], MD    ADMISSION DIAGNOSIS:  Thrombocytopenia ([place]) [D69.6] General weakness [R53.1] Symptomatic anemia [D64.9] Prostate cancer metastatic to bone ([facility]) [C61, C79.51]   DISCHARGE DIAGNOSIS:  Acute respiratory failure with COPD exacerbation. acute renal failure  hyperkalemia Lactic acidosis Anemia Hyponatremia thrombocytopenia SECONDARY DIAGNOSIS:   Past Medical History  Diagnosis Date  . 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 Pneumonia   . Arthritis   . Anemia   . Thrombocytopenia ([place])   . Vertigo     HOSPITAL COURSE:   #1 acute renal failure. Worsening. He was treated with NS iv. But no HD per patient, daughter and Dr. Aletha Halim discussion.   #2 hyperkalemia-is likely result of the patient's acute renal failure. K still high at 6.8 after treatment, given another D50, insulin and calcium gluconate this am.  #3 anemia-this is acute on chronic anemia and likely related to his chemotherapy.  S/p PRBC, still low hemoglobin at 6.3. Discussed with pt's daughter, she wanted transfusion this am.  #4 thrombocytopenia-also chronic and probably related to his chemotherapy and underlying malignancy. Still low.  #5 refractory prostate cancer-continue pain control with as needed morphine.  #6 Acute respiratory failure with COPD exacerbation. On BIPAP, treated with IV steroids, DuoNeb nebs.  #7 BPH-on doxazosin.  Hyponatremia. Was on NS iv. Lactic acidosis.  Was on IVF.   The patient's family made decision for comfort care this afternoon.  Dr. Rogelia Mire notified of patient's death at 15:17 today.   DISCHARGE CONDITIONS:   Expired at 15:17 today.  CONSULTS OBTAINED:   Treatment Team:  Anthonette Legato, MD Cammie Sickle, MD Laverle Hobby, MD    Demetrios Loll M.D on April 15, 2015 at 5:24 PM  Between 7am to 6pm - Pager - [phone]  After 6pm go to [email or web address] - password [email or web address] [facility]  Office  [phone]  CC: Primary care physician; [name], MD

## 2015-04-30 DEATH — deceased

## 2015-05-29 ENCOUNTER — Encounter: Payer: Medicare Other | Admitting: Pain Medicine

## 2015-06-14 ENCOUNTER — Ambulatory Visit: Payer: Medicare Other | Admitting: Radiation Oncology

## 2016-09-09 IMAGING — CT CT CHEST W/ CM
4 of 5 series · 15 of 36 positions shown, 17 images · IV contrast (omnipaque)
Comparison: CT 11/09/2014 and 07/22/2014.

CLINICAL DATA: Restaging lung cancer post completion of radiation
therapy. Productive cough with congestion. History of prostate
cancer. Subsequent encounter.

EXAM:
CT CHEST, ABDOMEN, AND PELVIS WITH CONTRAST
TECHNIQUE: Multidetector CT imaging of the chest, abdomen and pelvis was
performed following the standard protocol during bolus
administration of intravenous contrast.
CONTRAST:  100mL OMNIPAQUE IOHEXOL 300 MG/ML  SOLN

[Series 2: soft tissue · axial · 0.74mm/px · z∈[-847,-577]mm · 4 of 134 slices shown]
[im 9/134  mediastinal]
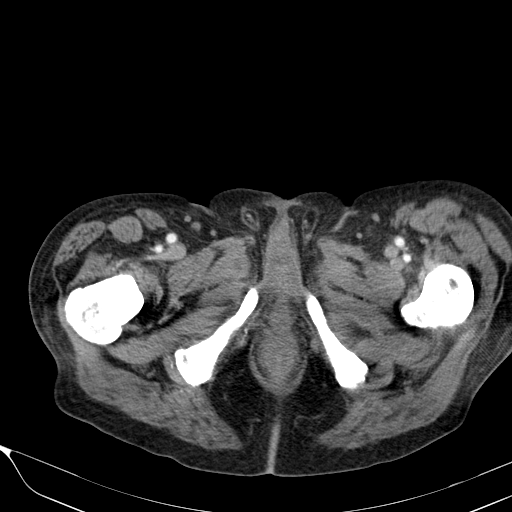
[im 27/134  mediastinal]
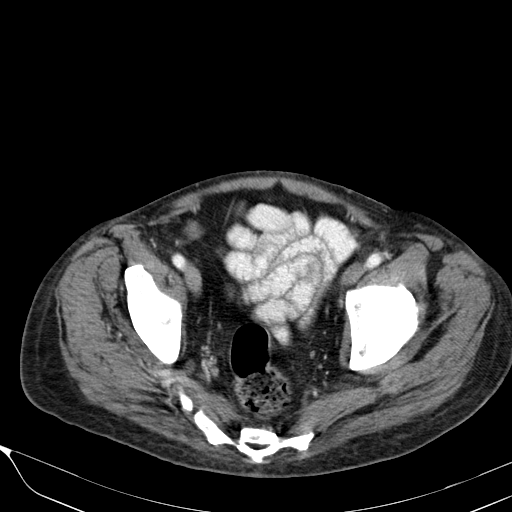
[im 45/134  mediastinal]
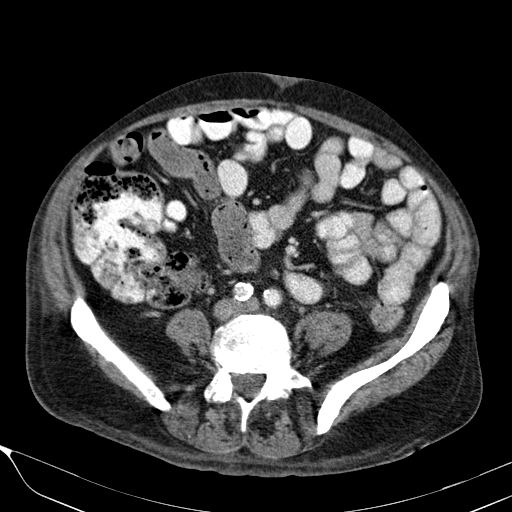
[im 63/134  mediastinal]
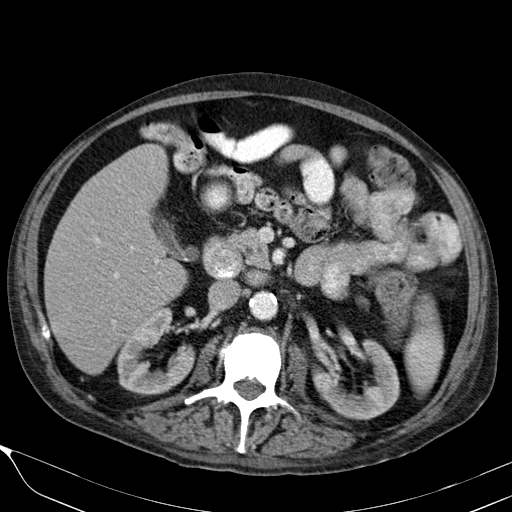

[Series 5: lung · axial · 0.74mm/px · z∈[-512,-272]mm · 6 of 68 slices shown, 8 images]
[im 10/68  mediastinal]
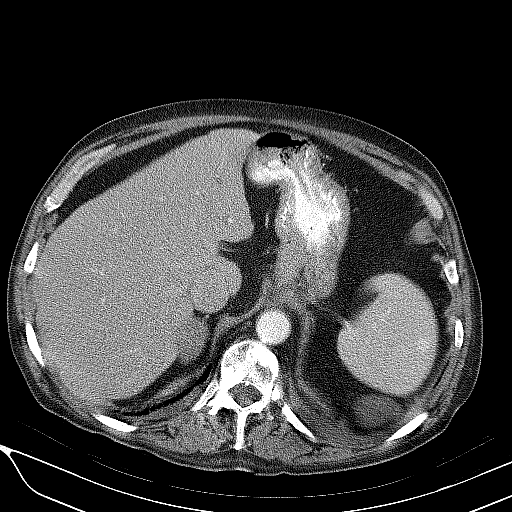
[im 10/68  lung]
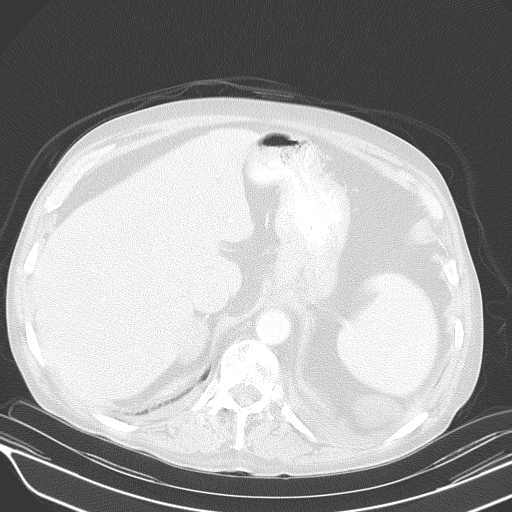
[im 20/68  lung]
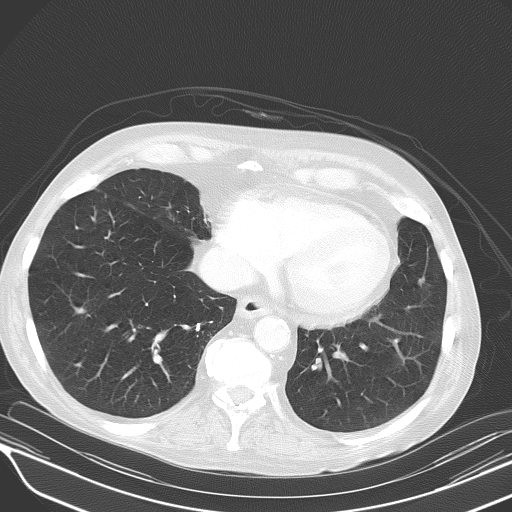
[im 29/68  lung]
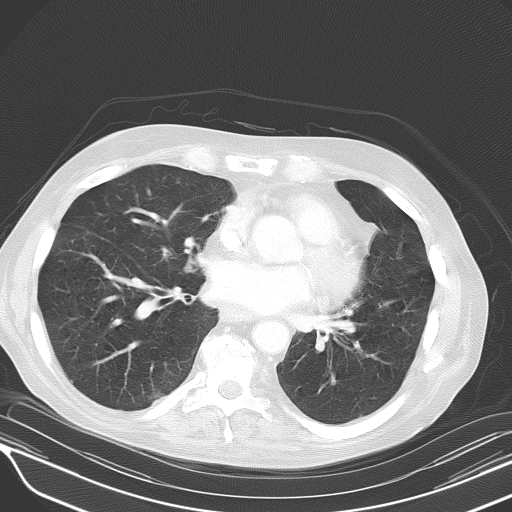
[im 39/68  lung]
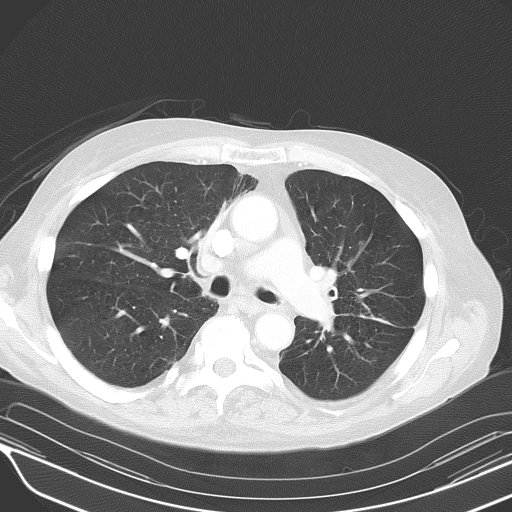
[im 48/68  mediastinal]
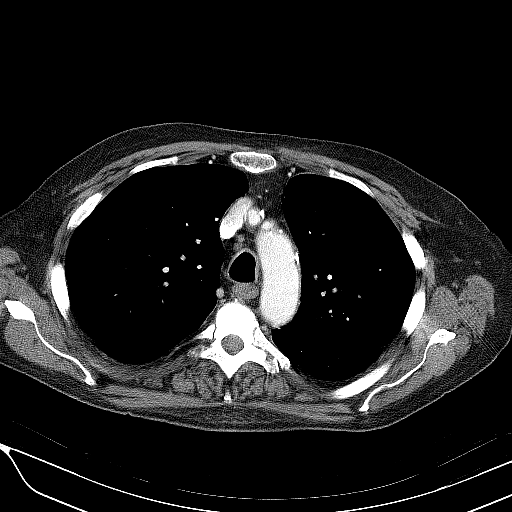
[im 48/68  lung]
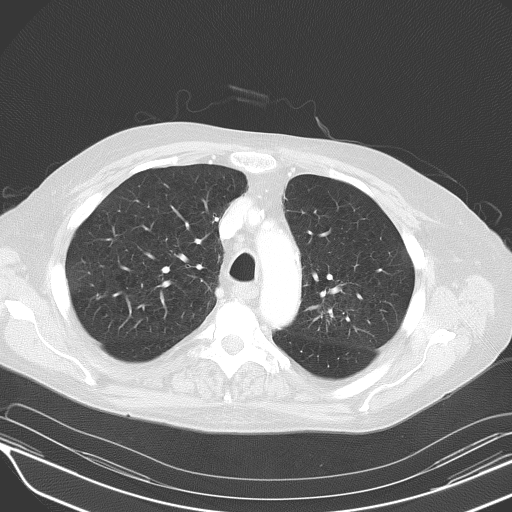
[im 58/68  lung]
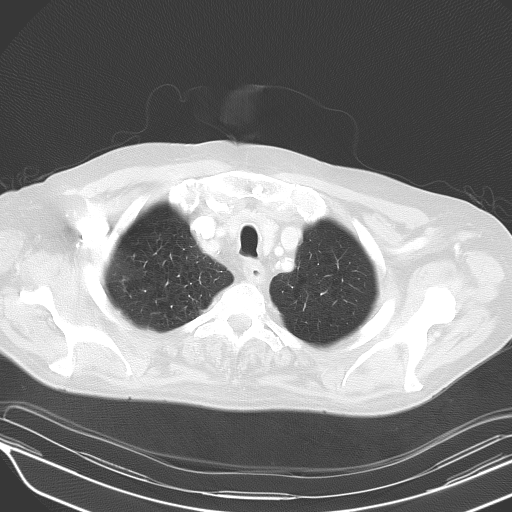

[Series 6: axial delay · axial · delayed · 0.74mm/px · z∈[-616,-556]mm · 2 of 36 slices shown]
[im 12/36  lung]
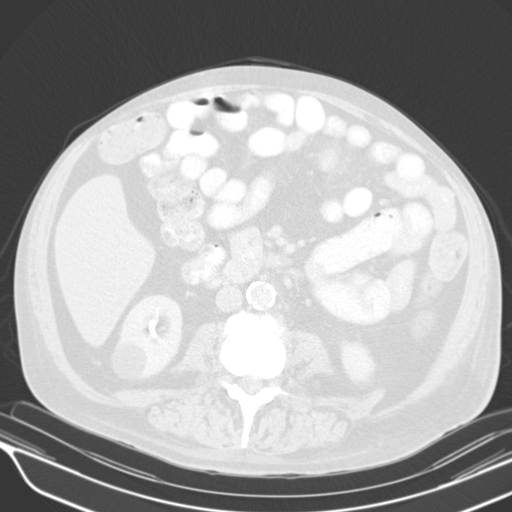
[im 24/36  lung]
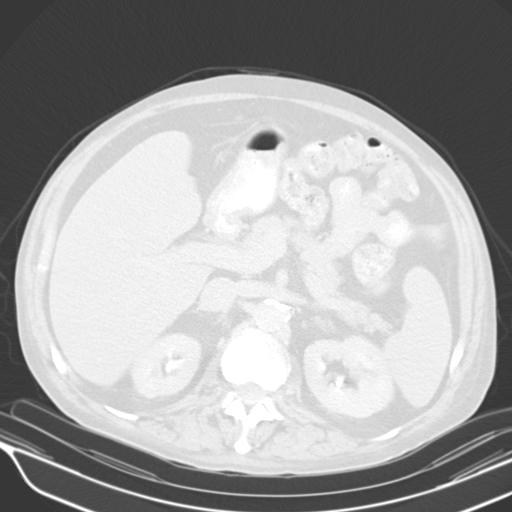

[Series 602: coronals · coronal · 1.29mm/px · 3 of 153 slices shown]
[im 31/153  lung]
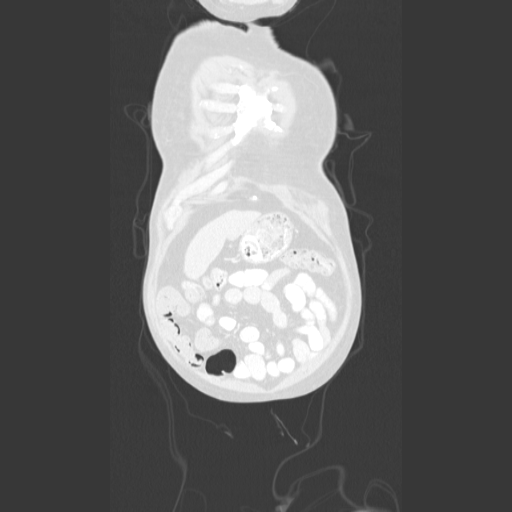
[im 61/153  lung]
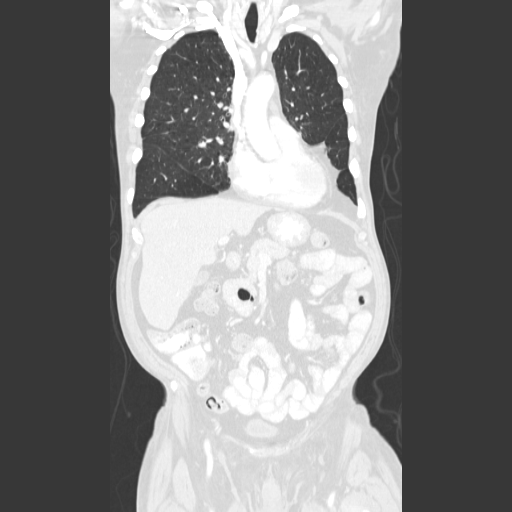
[im 92/153  lung]
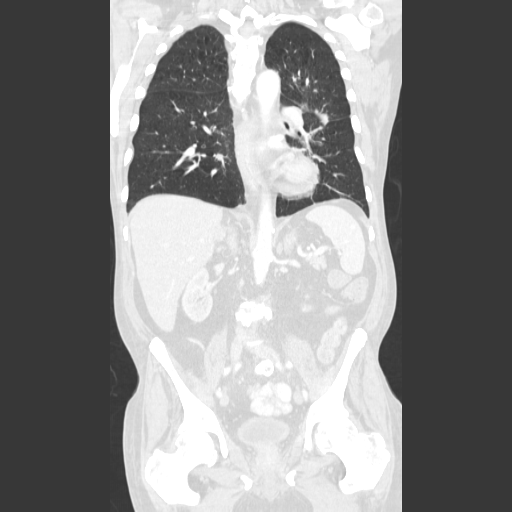

[15 of 36 positions shown; findings below may reference images not displayed]

FINDINGS: Mediastinum/Nodes: There are no enlarged mediastinal, hilar or
axillary lymph nodes. Several calcified mediastinal lymph nodes are
again noted. There is stable nodularity and calcification within the
thyroid gland and stable mild esophageal wall thickening. The heart
size is normal. There is a small amount of pericardial fluid which
appears unchanged. Extensive atherosclerosis of the aorta, great
vessels and coronary arteries again noted.

Lungs/Pleura: There are new trace left greater than right pleural
effusions. The irregular left upper lobe mass lying just superior to
the major fissure appears slightly smaller, measuring 2.5 x 2.4 cm
on image 34. The lesion previously demonstrated more medially in the
left upper lobe has resolved. There are no new or enlarging
pulmonary nodules. Moderate emphysematous changes are present
throughout the lungs.

Musculoskeletal/Chest wall: Widespread blastic metastatic disease is
grossly stable. No lytic component, pathologic fracture or epidural
tumor identified. There is a stable anterolisthesis at the T6-7 and
T7-8 levels.

CT ABDOMEN AND PELVIS FINDINGS

Hepatobiliary: The liver is normal in density without focal
abnormality. Small calcified gallstones again noted. There is no
gallbladder wall thickening or biliary dilatation.

Pancreas: Unremarkable. No pancreatic ductal dilatation or
surrounding inflammatory changes.

Spleen: The spleen appears stable with small hypervascular lesions
and granulomas. No suspicious findings.

Adrenals/Urinary Tract: Probable right adrenal metastasis appears
slightly smaller, measuring 3.9 x 3.2 cm on image 62 (previously
x 3.1 cm). There is a 1.9 cm left adrenal nodule which appears
unchanged. Bilateral renal cysts appear unchanged. There is no
hydronephrosis or urinary tract calculus. There is stable mild
diffuse bladder wall thickening.

Stomach/Bowel: No evidence of bowel wall thickening, distention or
surrounding inflammatory change. Large duodenal diverticulum again
noted. The appendix appears normal.

Vascular/Lymphatic: There are no enlarged abdominal or pelvic lymph
nodes. There is diffuse atherosclerosis of aorta, its branches and
iliac arteries.

Reproductive: The prostate gland and seminal vesicles are small,
likely related to previous radiation therapy. New mass lesion
observed.

Other: Stable small umbilical hernia containing only fat. No ascites
or peritoneal nodularity.

Musculoskeletal: Widespread blastic metastatic disease is grossly
stable. No lytic component, pathologic fracture or epidural tumor
identified.
IMPRESSION: 1. Interval improvement in the left upper lobe lung masses
consistent with response to therapy. No new pulmonary nodules.
2. Minimal improvement in right adrenal metastasis. Chronic small
left adrenal nodule unchanged.
3. Widespread blastic metastatic disease the bones appear stable,
but attributed to the patient's prostate cancer.
4. No disease progression or acute findings observed.
5. Stable incidental findings including extensive atherosclerosis,
cholelithiasis and bladder wall thickening.
